# Patient Record
Sex: Female | Born: 1985
Health system: Southern US, Community
[De-identification: ages and names within clinical notes are randomized; demographics above are authoritative.]

## PROBLEM LIST (undated history)

## (undated) DIAGNOSIS — D219 Benign neoplasm of connective and other soft tissue, unspecified: Secondary | ICD-10-CM

## (undated) HISTORY — DX: Benign neoplasm of connective and other soft tissue, unspecified: D21.9

## (undated) HISTORY — PX: WISDOM TOOTH EXTRACTION: SHX21

---

## 2017-04-06 DIAGNOSIS — Z6833 Body mass index (BMI) 33.0-33.9, adult: Secondary | ICD-10-CM | POA: Diagnosis not present

## 2017-04-06 DIAGNOSIS — Z369 Encounter for antenatal screening, unspecified: Secondary | ICD-10-CM | POA: Diagnosis not present

## 2017-04-06 DIAGNOSIS — Z113 Encounter for screening for infections with a predominantly sexual mode of transmission: Secondary | ICD-10-CM | POA: Diagnosis not present

## 2017-04-06 DIAGNOSIS — N925 Other specified irregular menstruation: Secondary | ICD-10-CM | POA: Diagnosis not present

## 2017-04-06 DIAGNOSIS — Z01419 Encounter for gynecological examination (general) (routine) without abnormal findings: Secondary | ICD-10-CM | POA: Diagnosis not present

## 2017-04-06 DIAGNOSIS — Z124 Encounter for screening for malignant neoplasm of cervix: Secondary | ICD-10-CM | POA: Diagnosis not present

## 2017-04-06 LAB — OB RESULTS CONSOLE ABO/RH: RH Type: POSITIVE

## 2017-04-06 LAB — OB RESULTS CONSOLE RUBELLA ANTIBODY, IGM: Rubella: IMMUNE

## 2017-04-06 LAB — OB RESULTS CONSOLE ANTIBODY SCREEN: ANTIBODY SCREEN: NEGATIVE

## 2017-04-06 LAB — OB RESULTS CONSOLE HEPATITIS B SURFACE ANTIGEN: Hepatitis B Surface Ag: NEGATIVE

## 2017-04-06 LAB — OB RESULTS CONSOLE GC/CHLAMYDIA
CHLAMYDIA, DNA PROBE: NEGATIVE
GC PROBE AMP, GENITAL: NEGATIVE

## 2017-04-06 LAB — OB RESULTS CONSOLE HIV ANTIBODY (ROUTINE TESTING): HIV: NONREACTIVE

## 2017-04-06 LAB — OB RESULTS CONSOLE RPR: RPR: NONREACTIVE

## 2017-04-12 DIAGNOSIS — O3680X0 Pregnancy with inconclusive fetal viability, not applicable or unspecified: Secondary | ICD-10-CM | POA: Diagnosis not present

## 2017-04-12 DIAGNOSIS — Z3A01 Less than 8 weeks gestation of pregnancy: Secondary | ICD-10-CM | POA: Diagnosis not present

## 2017-04-12 DIAGNOSIS — O26859 Spotting complicating pregnancy, unspecified trimester: Secondary | ICD-10-CM | POA: Diagnosis not present

## 2017-04-12 DIAGNOSIS — D259 Leiomyoma of uterus, unspecified: Secondary | ICD-10-CM | POA: Diagnosis not present

## 2017-06-07 DIAGNOSIS — Z3492 Encounter for supervision of normal pregnancy, unspecified, second trimester: Secondary | ICD-10-CM | POA: Diagnosis not present

## 2017-06-07 DIAGNOSIS — Z8619 Personal history of other infectious and parasitic diseases: Secondary | ICD-10-CM | POA: Diagnosis not present

## 2017-06-07 DIAGNOSIS — Z3A14 14 weeks gestation of pregnancy: Secondary | ICD-10-CM | POA: Diagnosis not present

## 2017-07-05 DIAGNOSIS — Z3492 Encounter for supervision of normal pregnancy, unspecified, second trimester: Secondary | ICD-10-CM | POA: Diagnosis not present

## 2017-07-05 DIAGNOSIS — Z3A18 18 weeks gestation of pregnancy: Secondary | ICD-10-CM | POA: Diagnosis not present

## 2017-07-07 DIAGNOSIS — O139 Gestational [pregnancy-induced] hypertension without significant proteinuria, unspecified trimester: Secondary | ICD-10-CM | POA: Diagnosis not present

## 2017-07-22 DIAGNOSIS — Z3A20 20 weeks gestation of pregnancy: Secondary | ICD-10-CM | POA: Diagnosis not present

## 2017-07-22 DIAGNOSIS — O10019 Pre-existing essential hypertension complicating pregnancy, unspecified trimester: Secondary | ICD-10-CM | POA: Diagnosis not present

## 2017-07-22 DIAGNOSIS — Z363 Encounter for antenatal screening for malformations: Secondary | ICD-10-CM | POA: Diagnosis not present

## 2017-07-22 DIAGNOSIS — Z369 Encounter for antenatal screening, unspecified: Secondary | ICD-10-CM | POA: Diagnosis not present

## 2017-07-22 DIAGNOSIS — Z6839 Body mass index (BMI) 39.0-39.9, adult: Secondary | ICD-10-CM | POA: Diagnosis not present

## 2017-08-18 DIAGNOSIS — O10019 Pre-existing essential hypertension complicating pregnancy, unspecified trimester: Secondary | ICD-10-CM | POA: Diagnosis not present

## 2017-08-18 DIAGNOSIS — Z3A24 24 weeks gestation of pregnancy: Secondary | ICD-10-CM | POA: Diagnosis not present

## 2017-09-15 DIAGNOSIS — Z3A28 28 weeks gestation of pregnancy: Secondary | ICD-10-CM | POA: Diagnosis not present

## 2017-09-15 DIAGNOSIS — O169 Unspecified maternal hypertension, unspecified trimester: Secondary | ICD-10-CM | POA: Diagnosis not present

## 2017-09-15 DIAGNOSIS — I10 Essential (primary) hypertension: Secondary | ICD-10-CM | POA: Diagnosis not present

## 2017-09-15 DIAGNOSIS — Z369 Encounter for antenatal screening, unspecified: Secondary | ICD-10-CM | POA: Diagnosis not present

## 2017-10-14 DIAGNOSIS — I10 Essential (primary) hypertension: Secondary | ICD-10-CM | POA: Diagnosis not present

## 2017-10-14 DIAGNOSIS — Z3A32 32 weeks gestation of pregnancy: Secondary | ICD-10-CM | POA: Diagnosis not present

## 2017-10-14 DIAGNOSIS — Z3493 Encounter for supervision of normal pregnancy, unspecified, third trimester: Secondary | ICD-10-CM | POA: Diagnosis not present

## 2017-10-21 DIAGNOSIS — I10 Essential (primary) hypertension: Secondary | ICD-10-CM | POA: Diagnosis not present

## 2017-10-27 DIAGNOSIS — Z3A34 34 weeks gestation of pregnancy: Secondary | ICD-10-CM | POA: Diagnosis not present

## 2017-10-27 DIAGNOSIS — O10019 Pre-existing essential hypertension complicating pregnancy, unspecified trimester: Secondary | ICD-10-CM | POA: Diagnosis not present

## 2017-10-27 DIAGNOSIS — Z23 Encounter for immunization: Secondary | ICD-10-CM | POA: Diagnosis not present

## 2017-10-27 DIAGNOSIS — I10 Essential (primary) hypertension: Secondary | ICD-10-CM | POA: Diagnosis not present

## 2017-11-05 DIAGNOSIS — Z3A36 36 weeks gestation of pregnancy: Secondary | ICD-10-CM | POA: Diagnosis not present

## 2017-11-05 DIAGNOSIS — O10019 Pre-existing essential hypertension complicating pregnancy, unspecified trimester: Secondary | ICD-10-CM | POA: Diagnosis not present

## 2017-11-11 DIAGNOSIS — Z369 Encounter for antenatal screening, unspecified: Secondary | ICD-10-CM | POA: Diagnosis not present

## 2017-11-11 DIAGNOSIS — Z3A36 36 weeks gestation of pregnancy: Secondary | ICD-10-CM | POA: Diagnosis not present

## 2017-11-11 DIAGNOSIS — O10019 Pre-existing essential hypertension complicating pregnancy, unspecified trimester: Secondary | ICD-10-CM | POA: Diagnosis not present

## 2017-11-11 LAB — OB RESULTS CONSOLE GBS: GBS: POSITIVE

## 2017-11-17 DIAGNOSIS — O10019 Pre-existing essential hypertension complicating pregnancy, unspecified trimester: Secondary | ICD-10-CM | POA: Diagnosis not present

## 2017-11-17 DIAGNOSIS — Z3A37 37 weeks gestation of pregnancy: Secondary | ICD-10-CM | POA: Diagnosis not present

## 2017-11-24 DIAGNOSIS — Z3493 Encounter for supervision of normal pregnancy, unspecified, third trimester: Secondary | ICD-10-CM | POA: Diagnosis not present

## 2017-11-24 DIAGNOSIS — Z3A38 38 weeks gestation of pregnancy: Secondary | ICD-10-CM | POA: Diagnosis not present

## 2017-11-24 DIAGNOSIS — O10019 Pre-existing essential hypertension complicating pregnancy, unspecified trimester: Secondary | ICD-10-CM | POA: Diagnosis not present

## 2017-11-25 ENCOUNTER — Telehealth (HOSPITAL_COMMUNITY): Payer: Self-pay | Admitting: *Deleted

## 2017-11-25 ENCOUNTER — Encounter (HOSPITAL_COMMUNITY): Payer: Self-pay | Admitting: *Deleted

## 2017-11-25 NOTE — Telephone Encounter (Signed)
Preadmission screen  

## 2017-11-26 ENCOUNTER — Other Ambulatory Visit: Payer: Self-pay | Admitting: Obstetrics & Gynecology

## 2017-11-28 ENCOUNTER — Inpatient Hospital Stay (HOSPITAL_COMMUNITY)
Admission: RE | Admit: 2017-11-28 | Discharge: 2017-11-28 | Disposition: A | Discharge status: Home / Self Care | Payer: BLUE CROSS/BLUE SHIELD | Source: Ambulatory Visit | Attending: Obstetrics & Gynecology | Admitting: Obstetrics & Gynecology

## 2017-11-30 ENCOUNTER — Encounter (HOSPITAL_COMMUNITY): Payer: Self-pay | Admitting: *Deleted

## 2017-11-30 ENCOUNTER — Telehealth (HOSPITAL_COMMUNITY): Payer: Self-pay | Admitting: *Deleted

## 2017-11-30 ENCOUNTER — Other Ambulatory Visit: Payer: Self-pay

## 2017-11-30 ENCOUNTER — Inpatient Hospital Stay (HOSPITAL_COMMUNITY)
Admission: AD | Admit: 2017-11-30 | Discharge: 2017-12-06 | DRG: 787 | Disposition: A | Discharge status: Home / Self Care | Payer: BLUE CROSS/BLUE SHIELD | Source: Ambulatory Visit | Attending: Obstetrics and Gynecology | Admitting: Obstetrics and Gynecology

## 2017-11-30 DIAGNOSIS — J1189 Influenza due to unidentified influenza virus with other manifestations: Secondary | ICD-10-CM | POA: Diagnosis not present

## 2017-11-30 DIAGNOSIS — O99824 Streptococcus B carrier state complicating childbirth: Secondary | ICD-10-CM | POA: Diagnosis not present

## 2017-11-30 DIAGNOSIS — O9963 Diseases of the digestive system complicating the puerperium: Secondary | ICD-10-CM | POA: Diagnosis not present

## 2017-11-30 DIAGNOSIS — J111 Influenza due to unidentified influenza virus with other respiratory manifestations: Secondary | ICD-10-CM | POA: Diagnosis present

## 2017-11-30 DIAGNOSIS — O164 Unspecified maternal hypertension, complicating childbirth: Secondary | ICD-10-CM | POA: Diagnosis not present

## 2017-11-30 DIAGNOSIS — Z3A00 Weeks of gestation of pregnancy not specified: Secondary | ICD-10-CM | POA: Diagnosis not present

## 2017-11-30 DIAGNOSIS — O1002 Pre-existing essential hypertension complicating childbirth: Principal | ICD-10-CM | POA: Diagnosis present

## 2017-11-30 DIAGNOSIS — O9952 Diseases of the respiratory system complicating childbirth: Secondary | ICD-10-CM | POA: Diagnosis present

## 2017-11-30 DIAGNOSIS — R112 Nausea with vomiting, unspecified: Secondary | ICD-10-CM | POA: Diagnosis not present

## 2017-11-30 DIAGNOSIS — O3413 Maternal care for benign tumor of corpus uteri, third trimester: Secondary | ICD-10-CM | POA: Diagnosis present

## 2017-11-30 DIAGNOSIS — O43893 Other placental disorders, third trimester: Secondary | ICD-10-CM | POA: Diagnosis not present

## 2017-11-30 DIAGNOSIS — O10013 Pre-existing essential hypertension complicating pregnancy, third trimester: Secondary | ICD-10-CM | POA: Diagnosis not present

## 2017-11-30 DIAGNOSIS — Z3A39 39 weeks gestation of pregnancy: Secondary | ICD-10-CM

## 2017-11-30 DIAGNOSIS — K567 Ileus, unspecified: Secondary | ICD-10-CM | POA: Diagnosis not present

## 2017-11-30 DIAGNOSIS — D259 Leiomyoma of uterus, unspecified: Secondary | ICD-10-CM | POA: Diagnosis present

## 2017-11-30 DIAGNOSIS — J101 Influenza due to other identified influenza virus with other respiratory manifestations: Secondary | ICD-10-CM | POA: Diagnosis present

## 2017-11-30 DIAGNOSIS — Z98891 History of uterine scar from previous surgery: Secondary | ICD-10-CM

## 2017-11-30 DIAGNOSIS — O3663X0 Maternal care for excessive fetal growth, third trimester, not applicable or unspecified: Secondary | ICD-10-CM | POA: Diagnosis not present

## 2017-11-30 DIAGNOSIS — O139 Gestational [pregnancy-induced] hypertension without significant proteinuria, unspecified trimester: Secondary | ICD-10-CM | POA: Diagnosis not present

## 2017-11-30 HISTORY — DX: Pre-existing essential hypertension complicating pregnancy, third trimester: O10.013

## 2017-11-30 LAB — INFLUENZA PANEL BY PCR (TYPE A & B)
INFLAPCR: POSITIVE — AB
Influenza B By PCR: NEGATIVE

## 2017-11-30 LAB — COMPREHENSIVE METABOLIC PANEL
ALT: 14 U/L (ref 14–54)
ANION GAP: 10 (ref 5–15)
AST: 20 U/L (ref 15–41)
Albumin: 3.1 g/dL — ABNORMAL LOW (ref 3.5–5.0)
Alkaline Phosphatase: 126 U/L (ref 38–126)
BILIRUBIN TOTAL: 0.6 mg/dL (ref 0.3–1.2)
BUN: 10 mg/dL (ref 6–20)
CO2: 19 mmol/L — ABNORMAL LOW (ref 22–32)
Calcium: 9.6 mg/dL (ref 8.9–10.3)
Chloride: 103 mmol/L (ref 101–111)
Creatinine, Ser: 0.54 mg/dL (ref 0.44–1.00)
GFR calc Af Amer: >60 mL/min (ref >60)
Glucose, Bld: 96 mg/dL (ref 65–99)
POTASSIUM: 4 mmol/L (ref 3.5–5.1)
Sodium: 132 mmol/L — ABNORMAL LOW (ref 135–145)
TOTAL PROTEIN: 7 g/dL (ref 6.5–8.1)

## 2017-11-30 LAB — CBC
HCT: 32.7 % — ABNORMAL LOW (ref 36.0–46.0)
Hemoglobin: 11.1 g/dL — ABNORMAL LOW (ref 12.0–15.0)
MCH: 29.1 pg (ref 26.0–34.0)
MCHC: 33.9 g/dL (ref 30.0–36.0)
MCV: 85.8 fL (ref 78.0–100.0)
PLATELETS: 182 10*3/uL (ref 150–400)
RBC: 3.81 MIL/uL — ABNORMAL LOW (ref 3.87–5.11)
RDW: 15 % (ref 11.5–15.5)
WBC: 8 10*3/uL (ref 4.0–10.5)

## 2017-11-30 LAB — PROTEIN / CREATININE RATIO, URINE
Creatinine, Urine: 58 mg/dL
Protein Creatinine Ratio: 0.14 mg/mg{Cre} (ref 0.00–0.15)
TOTAL PROTEIN, URINE: 8 mg/dL

## 2017-11-30 LAB — LACTATE DEHYDROGENASE: LDH: 149 U/L (ref 98–192)

## 2017-11-30 LAB — URIC ACID: Uric Acid, Serum: 4.1 mg/dL (ref 2.3–6.6)

## 2017-11-30 MED ORDER — ONDANSETRON HCL 4 MG/2ML IJ SOLN: 4.0000 mg | Freq: Four times a day (QID) | INTRAMUSCULAR | Status: DC | PRN

## 2017-11-30 MED ORDER — LACTATED RINGERS IV SOLN
INTRAVENOUS | Status: DC
  Administered 2017-11-30 – 2017-12-02 (×3): via INTRAVENOUS

## 2017-11-30 MED ORDER — FENTANYL CITRATE (PF) 100 MCG/2ML IJ SOLN
50.0000 ug | INTRAMUSCULAR | Status: DC | PRN
  Administered 2017-12-01: 100 ug via INTRAVENOUS
  Filled 2017-11-30: qty 2

## 2017-11-30 MED ORDER — ACETAMINOPHEN 325 MG PO TABS
650.0000 mg | ORAL_TABLET | ORAL | Status: DC | PRN
  Administered 2017-12-01: 650 mg via ORAL

## 2017-11-30 MED ORDER — LACTATED RINGERS IV SOLN: 500.0000 mL | INTRAVENOUS | Status: DC | PRN

## 2017-11-30 MED ORDER — PENICILLIN G POT IN DEXTROSE 60000 UNIT/ML IV SOLN
3.0000 10*6.[IU] | INTRAVENOUS | Status: DC
  Administered 2017-12-01 – 2017-12-02 (×6): 3 10*6.[IU] via INTRAVENOUS
  Filled 2017-11-30 (×13): qty 50

## 2017-11-30 MED ORDER — PENICILLIN G POTASSIUM 5000000 UNITS IJ SOLR
5.0000 10*6.[IU] | Freq: Once | INTRAMUSCULAR | Status: AC
  Administered 2017-12-01: 5 10*6.[IU] via INTRAVENOUS
  Filled 2017-11-30: qty 5

## 2017-11-30 MED ORDER — TERBUTALINE SULFATE 1 MG/ML IJ SOLN: 0.2500 mg | Freq: Once | INTRAMUSCULAR | Status: DC | PRN

## 2017-11-30 MED ORDER — FLEET ENEMA 7-19 GM/118ML RE ENEM: 1.0000 | ENEMA | RECTAL | Status: DC | PRN

## 2017-11-30 MED ORDER — OXYTOCIN 40 UNITS IN LACTATED RINGERS INFUSION - SIMPLE MED: 1.0000 m[IU]/min | INTRAVENOUS | Status: DC

## 2017-11-30 MED ORDER — LIDOCAINE HCL (PF) 1 % IJ SOLN: 30.0000 mL | INTRAMUSCULAR | Status: DC | PRN

## 2017-11-30 MED ORDER — OXYTOCIN 40 UNITS IN LACTATED RINGERS INFUSION - SIMPLE MED: 2.5000 [IU]/h | INTRAVENOUS | Status: DC

## 2017-11-30 MED ORDER — MISOPROSTOL 25 MCG QUARTER TABLET
25.0000 ug | ORAL_TABLET | ORAL | Status: DC | PRN
Start: 2017-11-30 — End: 2017-12-02
  Administered 2017-11-30 – 2017-12-01 (×2): 25 ug via VAGINAL
  Filled 2017-11-30 (×3): qty 1

## 2017-11-30 MED ORDER — OXYTOCIN BOLUS FROM INFUSION: 500.0000 mL | Freq: Once | INTRAVENOUS | Status: DC

## 2017-11-30 MED ORDER — SOD CITRATE-CITRIC ACID 500-334 MG/5ML PO SOLN
30.0000 mL | ORAL | Status: DC | PRN
  Administered 2017-12-02: 30 mL via ORAL
  Filled 2017-11-30: qty 15

## 2017-11-30 MED ORDER — OSELTAMIVIR PHOSPHATE 75 MG PO CAPS
75.0000 mg | ORAL_CAPSULE | Freq: Two times a day (BID) | ORAL | Status: DC
  Administered 2017-11-30 – 2017-12-02 (×4): 75 mg via ORAL
  Filled 2017-11-30 (×6): qty 1

## 2017-11-30 NOTE — H&P (Addendum)
Alice Nash is a 32 y.o. female presenting for induction due to presumed CHTN with an exacerbation of BPs today in the office as high as 180/102 and 170/82 at about 4:30pm.  Pt denies HAs, visual changes, abdominal or RUQ pain and reports good FM, no VB, no LOF and no CTXs.  First BP here at hospital was 169/89 and repeat was 151/74.  BPs prior to 20wks 130s-150s/70s-80s.  Pt on no meds for BP before or during pregnancy.  Husband recently dx's with the flu and started on tamiflu yesterday.  She does not recall him having a fever.  OB History    Gravida Para Term Preterm AB Living   1             SAB TAB Ectopic Multiple Live Births           0     Past Medical History:  Diagnosis Date  . Fibroid    Past Surgical History:  Procedure Laterality Date  . WISDOM TOOTH EXTRACTION     Family History: family history includes Hypertension in her mother; Kidney cancer in her father. Social History:  reports that  has never smoked. she has never used smokeless tobacco. She reports that she does not drink alcohol or use drugs.     Maternal Diabetes: No Genetic Screening: Declined Maternal Ultrasounds/Referrals: Normal Fetal Ultrasounds or other Referrals:  None Maternal Substance Abuse:  No Significant Maternal Medications:  None Significant Maternal Lab Results:  Lab values include: Group B Strep positive Other Comments:  None  ROS  Reports a slight cough, but denies F/C/N/V/D.  History   Blood pressure (!) 151/74, pulse (!) 110, temperature 99.1 F (37.3 C), temperature source Oral, resp. rate 18, height 5\' 4"  (1.626 m), weight 215 lb (97.5 kg), last menstrual period 02/20/2017. Exam Physical Exam  Lungs CTA CV RRR Abd gravid, NT Ext no calf tenderness FHT 150, +accels, no decels, mod variability Toco none Bedside u/s - cephalic presentation Cervical exam in the office by Dr. Charlesetta Garibaldi closed/50%/-4  Prenatal labs: ABO, Rh: O/Positive/-- (05/29 0000) Antibody: Negative  (05/29 0000) Rubella: Immune (05/29 0000) RPR: Nonreactive (05/29 0000)  HBsAg: Negative (05/29 0000)  HIV: Non-reactive (05/29 0000)  GBS: Positive (01/03 0000)   Assessment/Plan: P0 at 49 1/7wks with CHTN and exacerbation in the office earlier today.  If develops persistent severe range BPs, will start MgSO4 for SI severe preeclampsia.  Labs ordered.  Due to pts exposure to the flu, will do flu swab but pt has no obvious sxs suggesting she has the flu.  The husband will be allowed to come to the hospital per hospital protocol.  Cervix is unfavorable.  Will start induction with cervical ripening via cytotec.  I discussed r/b/a with pt including the increased risk of c/s.  Questions answered.  Fetal status is cat 1.  GHTN labs, Urine PCR and Flu swab pending.  PCN for GBS +.  Pt may have pain medicine upon request.  Delice Lesch 11/30/2017, 7:16 PM

## 2017-11-30 NOTE — H&P (Signed)
Already completed.  Made in error [employer] Past Medical History:  Diagnosis Date  . Fibroid

## 2017-11-30 NOTE — Telephone Encounter (Signed)
Preadmission screen  

## 2017-12-01 ENCOUNTER — Encounter (HOSPITAL_COMMUNITY): Payer: Self-pay | Admitting: *Deleted

## 2017-12-01 LAB — RPR: RPR Ser Ql: NONREACTIVE

## 2017-12-01 MED ORDER — PHENYLEPHRINE 40 MCG/ML (10ML) SYRINGE FOR IV PUSH (FOR BLOOD PRESSURE SUPPORT): 80.0000 ug | PREFILLED_SYRINGE | INTRAVENOUS | Status: DC | PRN

## 2017-12-01 MED ORDER — OXYTOCIN 40 UNITS IN LACTATED RINGERS INFUSION - SIMPLE MED
1.0000 m[IU]/min | INTRAVENOUS | Status: DC
  Administered 2017-12-01: 2 m[IU]/min via INTRAVENOUS
  Filled 2017-12-01: qty 1000

## 2017-12-01 MED ORDER — ZOLPIDEM TARTRATE 5 MG PO TABS: 5.0000 mg | ORAL_TABLET | Freq: Every evening | ORAL | Status: DC | PRN

## 2017-12-01 MED ORDER — DIPHENHYDRAMINE HCL 50 MG/ML IJ SOLN: 12.5000 mg | INTRAMUSCULAR | Status: DC | PRN

## 2017-12-01 MED ORDER — TERBUTALINE SULFATE 1 MG/ML IJ SOLN: 0.2500 mg | Freq: Once | INTRAMUSCULAR | Status: DC | PRN

## 2017-12-01 MED ORDER — FENTANYL 2.5 MCG/ML BUPIVACAINE 1/10 % EPIDURAL INFUSION (WH - ANES): 14.0000 mL/h | INTRAMUSCULAR | Status: DC | PRN

## 2017-12-01 MED ORDER — MISOPROSTOL 25 MCG QUARTER TABLET
25.0000 ug | ORAL_TABLET | ORAL | Status: DC
  Administered 2017-12-01: 25 ug via VAGINAL
  Filled 2017-12-01 (×5): qty 1

## 2017-12-01 MED ORDER — EPHEDRINE 5 MG/ML INJ: 10.0000 mg | INTRAVENOUS | Status: DC | PRN

## 2017-12-01 MED ORDER — ACETAMINOPHEN 325 MG PO TABS
650.0000 mg | ORAL_TABLET | Freq: Four times a day (QID) | ORAL | Status: DC | PRN
  Filled 2017-12-01: qty 2

## 2017-12-01 MED ORDER — LACTATED RINGERS IV SOLN
500.0000 mL | Freq: Once | INTRAVENOUS | Status: AC
  Administered 2017-12-02: 500 mL via INTRAVENOUS

## 2017-12-01 NOTE — Progress Notes (Signed)
[facility] for Infectious Disease  Total days of antibiotics 2 tamiflu/day 1 PCN G       Reason for Consult: recommendations for flu positive laboring mother   Referring Physician: Alwyn Pea  Active Problems:   Chronic benign essential hypertension in third trimester    HPI: Alice Nash is a 32 y.o. female G1P1, [redacted] weeks gestation who was admitted for induction due to hypertension. She reported that her husband was diagnosed with influenza and started on oseltamivir. She did not endorse cough or fever at that time. Primary tested her and dx with influenza A on 1/22.she was started on oseltamivir on 1/22 evening.  Primary team has asked Infection Prevention/ID for recommendations.   Prenatal labs: ABO, Rh: O/Positive/-- (05/29 0000) Antibody: Negative (05/29 0000) Rubella: Immune (05/29 0000) RPR: Nonreactive (05/29 0000)  HBsAg: Negative (05/29 0000)  HIV: Non-reactive (05/29 0000)  GBS: Positive (01/03 0000)     Past Medical History:  Diagnosis Date  . Fibroid     Allergies: No Known Allergies  MEDICATIONS: . misoprostol  25 mcg Vaginal Q4H  . oseltamivir  75 mg Oral BID  . oxytocin 40 units in LR 1000 mL  500 mL Intravenous Once    Social History   Tobacco Use  . Smoking status: Never Smoker  . Smokeless tobacco: Never Used  Substance Use Topics  . Alcohol use: No    Frequency: Never  . Drug use: No    Family History  Problem Relation Age of Onset  . Hypertension Mother   . Kidney cancer Father    LABS: Results for orders placed or performed during the hospital encounter of 11/30/17 (from the past 48 hour(s))  CBC     Status: Abnormal   Collection Time: 11/30/17  6:18 PM  Result Value Ref Range   WBC 8.0 4.0 - 10.5 K/uL   RBC 3.81 (L) 3.87 - 5.11 MIL/uL   Hemoglobin 11.1 (L) 12.0 - 15.0 g/dL   HCT 32.7 (L) 36.0 - 46.0 %   MCV 85.8 78.0 - 100.0 fL   MCH 29.1 26.0 - 34.0 pg   MCHC 33.9 30.0 - 36.0 g/dL   RDW 15.0 11.5 - 15.5 %   Platelets  182 150 - 400 K/uL  RPR     Status: None   Collection Time: 11/30/17  6:18 PM  Result Value Ref Range   RPR Ser Ql Non Reactive Non Reactive    Comment: (NOTE) Performed At: [facility] [place], [place] [id number] Rush Farmer MD DZ:[id number]   Comprehensive metabolic panel     Status: Abnormal   Collection Time: 11/30/17  6:18 PM  Result Value Ref Range   Sodium 132 (L) 135 - 145 mmol/L   Potassium 4.0 3.5 - 5.1 mmol/L   Chloride 103 101 - 111 mmol/L   CO2 19 (L) 22 - 32 mmol/L   Glucose, Bld 96 65 - 99 mg/dL   BUN 10 6 - 20 mg/dL   Creatinine, Ser 0.54 0.44 - 1.00 mg/dL   Calcium 9.6 8.9 - 10.3 mg/dL   Total Protein 7.0 6.5 - 8.1 g/dL   Albumin 3.1 (L) 3.5 - 5.0 g/dL   AST 20 15 - 41 U/L   ALT 14 14 - 54 U/L   Alkaline Phosphatase 126 38 - 126 U/L   Total Bilirubin 0.6 0.3 - 1.2 mg/dL   GFR calc non Af Amer >60 >60 mL/min   GFR calc Af Amer >60 >60  mL/min    Comment: (NOTE) The eGFR has been calculated using the CKD EPI equation. This calculation has not been validated in all clinical situations. eGFR's persistently <60 mL/min signify possible Chronic Kidney Disease.    Anion gap 10 5 - 15  Uric acid     Status: None   Collection Time: 11/30/17  6:18 PM  Result Value Ref Range   Uric Acid, Serum 4.1 2.3 - 6.6 mg/dL  Lactate dehydrogenase     Status: None   Collection Time: 11/30/17  6:18 PM  Result Value Ref Range   LDH 149 98 - 192 U/L  Influenza panel by PCR (type A & B)     Status: Abnormal   Collection Time: 11/30/17  6:46 PM  Result Value Ref Range   Influenza A By PCR POSITIVE (A) NEGATIVE    Comment: RESULT CALLED TO, READ BACK BY AND VERIFIED WITH: SULLIVAN K AT 1952 ON [id number] BY FORSYTH K    Influenza B By PCR NEGATIVE NEGATIVE    Comment: (NOTE) The Xpert Xpress Flu assay is intended as an aid in the diagnosis of  influenza and should not be used as a sole basis for treatment.  This  assay is FDA approved for nasopharyngeal  swab specimens only. Nasal  washings and aspirates are unacceptable for Xpert Xpress Flu testing.   Protein / creatinine ratio, urine     Status: None   Collection Time: 11/30/17  7:23 PM  Result Value Ref Range   Creatinine, Urine 58.00 mg/dL   Total Protein, Urine 8 mg/dL    Comment: NO NORMAL RANGE ESTABLISHED FOR THIS TEST   Protein Creatinine Ratio 0.14 0.00 - 0.15 mg/mg[Cre]    Assessment/Plan:  Influenza treatment and peri-post management   1) please assess if Alice Nash has received the flu vaccine. If not, please administer to her at discharge 2) continue to treat influenza with oseltamivir 77m BID x 10 doses 3) keep patient on standard /droplet precaution for hospitalized patients for 7 days after illness onset  4) patient and her family members and other visitors should be informed of the risks of influenza virus transmission and instructed to adhere to respiratory hygiene and cough etiquette, hand hygiene, and use of personal protective equipment (PPE)    At delivery: 1) staff to follow standard/droplet precaution. Alice SAldazdoes not have to be masked while she is laboring. Staff should be. 2) Do mask the mother once baby is delivered so that she may see/ touch/ hold/be on chest  Post Delivery: 1) please keep newborn separated from mom until she has had 48hrs of oseltamivir. The baby may then room with mother if she chooses 2) bring baby to mother (not have mother go inside nursery during this separation time) 2) keep baby at 66ft + from mother while in room- if she is not holding/feeding baby. If baby is closer than 6 ft., mother should wear mask 3) please teach mom to do hand hygiene before and after every contact with her infant 3) during the first 24-48hrs, baby should be bottlefed (with breast milk or supplementation) by a healthy caregiver/or staff member 4) mother can breastpump during first 24-48hrs , while they are separated and then resume breast-feeding once  they are in the same room- if mother chooses vs. Continue to use breastpump/bottle feed for next 5 d.  At Discharge: 5) offer vaccination for influenza to mother and recommend to father of baby if they had not received it  If questions, please contact IP on call or myself  Caren Griffins B. [facility] for Infectious Diseases [phone]

## 2017-12-01 NOTE — Anesthesia Pain Management Evaluation Note (Signed)
  CRNA Pain Management Visit Note  Patient: Alice Nash, 32 y.o., female  "Hello I am a member of the anesthesia team at [facility]. We have an anesthesia team available at all times to provide care throughout the hospital, including epidural management and anesthesia for C-section. I don't know your plan for the delivery whether it a natural birth, water birth, IV sedation, nitrous supplementation, doula or epidural, but we want to meet your pain goals."   1.Was your pain managed to your expectations on prior hospitalizations?   No prior hospitalizations  2.What is your expectation for pain management during this hospitalization?     Epidural  3.How can we help you reach that goal? Epidural when pain goal is reached.  Record the patient's initial score and the patient's pain goal.   Pain: 0  Pain Goal: 7 [facility] wants you to be able to say your pain was always managed very well.  [name] 12/01/2017

## 2017-12-01 NOTE — Progress Notes (Signed)
Subjective: Pt would like plan regarding recommendations for infant separation and feeding. Denies headache. Pt denies cough or body aches. Feeling some uterine cramping.  UPC negative Influenza A positive  Objective: BP (!) 151/81   Pulse (!) 110   Temp 99.6 F (37.6 C) (Oral)   Resp 16   Ht 5\' 4"  (1.626 m)   Wt 97.5 kg (215 lb)   LMP 02/20/2017   BMI 36.90 kg/m  No intake/output data recorded. No intake/output data recorded.  FHT: Category 1  FHT BL 150 variablity present no decels noted. UC:   regular, every 1-3 minutes SVE:   1/thick/-2 Discussed foley bulb insertion.  Inserted with speculum with 60cc fluid placed in bulb. Assessment:  G1P0 at 39.2 IUP induction for chronic hypertension Cat 1 strip Influenza A  Plan: Tamiflu Tylenol as needed ID consult in am for [place], MSN 12/01/2017, 2:02 AM

## 2017-12-01 NOTE — Progress Notes (Signed)
Received call last night from charge nurse Janett Billow on 11/10/17 at 38 regarding procedure for care for infant in the setting of mother and father with current influenza infection.  Advised that per CDC's website, it is recommended that infant be temporarily separated from mother with droplet precaution in place (while she is inpatient) for 7 days or until mother is completely symptom free, which ever is longer.  Suggested that infant could be cared for by other family members who are well in a separate room and given expressed breast milk or formula if no breast milk is available. Isolation would continue post-discharge until again 7 days from diagnosis or mother is symptom-free, which is longer.  Janett Billow agreed to alert nursing of these recommendations.  Received a call again this morning around 9:34 am from charge nurse Anderson Malta that mother requested a call from me regarding these isolation recommendations.  I explained to [place] that I was at the office with multiple patients but since mom was not actively laboring, I would call her personally once I finished my morning clinic.  I personally called mom at 12:02 pm and discussed the above recommendations.  Explained to mom that these recommendations were per the CDC.  Mom inquired about infant rooming in with her and I advised that he would need to remain 6 feet away and she would need to wear a mask and also do proper hand-washing when infant in close contact with her but that would not be 100% protective.  Explained that influenza infection in a neonate is a very serious infection which could result in severe complications and even death.  Mom is in the process of deciding if infant will room with her or if another well family member can care for the infant during the recommended isolation period.  I assured mom that I had personally spoken with charge nurse Anderson Malta and we would be able to provide a separate room for her infant to be cared for by a family  member while mom remained hospitalized.  Once infant is discharged home, then he should still be isolated from mother and father until either 7 days from diagnosis or family member is completely symptom free, which ever is longer.  Suggested that they decontaminate the apartment using Chlorox wipes and Lysol spray on all countertop surfaces, door knobs, and light switches and dad himself should be isolated to one room in the house presently.  The other option would be for infant to be temporarily housed in another family member's home if mom is worried about returning to the apartment immediately or if apartment has not been decontaminated yet.  Advised mom that lactation would be available to help her pump to protect her milk supply and infant will be able to bond with her once she is well as there are times when infants are initially cared for in the NICU or the mother is separated from the infant in the ICU but bonding does occur at a later time.  Mom should continue her Tamiflu as prescribed by her provider. All of mom's questions were answered and she noted that she would discuss this information with dad and make her decision.

## 2017-12-01 NOTE — Progress Notes (Signed)
Had a conversation with [facility] physician.  As long a mother has been on Tamiflu for 48 hours and is afebrile and showing no symptoms, she should be able to hold her baby w/o risk of Flu exposure.     Patient is currently admitted for induction of labor for gestational hypertension and her nasal swabs were positive for flu, however she is asymptomatic.   Alice Nash Alice Nash

## 2017-12-01 NOTE — Progress Notes (Addendum)
Leticia Coletta MRN: [id number]  Subjective: -Care assumed of 32 y.o. G1P0 at [redacted]w[redacted]d who presents for IOL s/t CHTN w/o official diagnosis prior to pregnancy and positive Influenza. In room to meet acquaintance of patient and family.  Patient reports comfort despite occasional contraction.  Denies LoF, VB, and reports active fetus.  Patient denies HA, Visual disturbances, RUQ Pain, Fever, Chills, N/V, and Diarrhea.   Objective: BP (!) 145/78   Pulse 89   Temp 98.8 F (37.1 C) (Oral)   Resp 16   Ht 5\' 4"  (1.626 m)   Wt 97.5 kg (215 lb)   LMP 02/20/2017   BMI 36.90 kg/m  No intake/output data recorded. No intake/output data recorded.  Fetal Monitoring: FHT: 145 bpm, Mod Var, -Decels, +Accels UC: Q43min, palpates mild to moderate    Physical Exam: General appearance: alert, well appearing, and in no distress. Chest: normal rate and regular rhythm.  clear to auscultation, no wheezes, rales or rhonchi, symmetric air entry. Abdominal exam: Soft RT, Gravid-Appears LGA (H/O Fibroids noted in [facility]) BS Present. Extremities: Edema Skin exam: Warm Dry  Vaginal Exam: SVE:   Dilation: 1.5 Effacement (%): Thick Station: -3 Exam by:: J.Michalene Debruler, CNM Membranes:Intact Internal Monitors: None  Augmentation/Induction: Pitocin:88mUn/min Cytotec: S/P Foley Bulb inserted w/o Incident  Assessment:  IUP at 39.2wks Cat I FT  CHTN Positive Influenza-Asymptomatic IOL Bishop Score: 3   Plan: -Discussed plan for delivery in case of positive influenza and attempt to minimize infant exposure. -Per Dr. Abner Greenspan (Peds) patient is to have no contact with infant at time of delivery despite tamiflu dosing.  Infant is to room in with family member while patient and husband remain in isolation.  No time frame given -Patient informed that this provider would be following recommendation of pediatrician and would therefore require patient to be masked at time of crowning and will hold infant up for patient to see  prior to handing off to nurses for isolation.  Patient in agreement with plan.  -Discussed foley bulb insertion including r/b, placement, associated discomfort, initiation of pitocin, and removal. -Will continue pitocin at current rate and hold until foley bulb comes out -Continue PCN dosing as scheduled -Discussed IV medication and epidural for pain management when desired -Continue other mgmt as ordered -Plan to reassess prn    Riley Churches, CNM 12/01/2017, 9:02 PM

## 2017-12-02 ENCOUNTER — Inpatient Hospital Stay (HOSPITAL_COMMUNITY): Payer: BLUE CROSS/BLUE SHIELD | Admitting: Anesthesiology

## 2017-12-02 ENCOUNTER — Encounter (HOSPITAL_COMMUNITY)
Admission: AD | Disposition: A | Discharge status: Home / Self Care | Payer: Self-pay | Source: Ambulatory Visit | Attending: Obstetrics and Gynecology

## 2017-12-02 LAB — CBC
HCT: 32 % — ABNORMAL LOW (ref 36.0–46.0)
HEMOGLOBIN: 10.9 g/dL — AB (ref 12.0–15.0)
MCH: 29.4 pg (ref 26.0–34.0)
MCHC: 34.1 g/dL (ref 30.0–36.0)
MCV: 86.3 fL (ref 78.0–100.0)
PLATELETS: 185 10*3/uL (ref 150–400)
RBC: 3.71 MIL/uL — AB (ref 3.87–5.11)
RDW: 15.2 % (ref 11.5–15.5)
WBC: 8.7 10*3/uL (ref 4.0–10.5)

## 2017-12-02 LAB — CREATININE, SERUM
Creatinine, Ser: 0.52 mg/dL (ref 0.44–1.00)
GFR calc Af Amer: >60 mL/min (ref >60)

## 2017-12-02 SURGERY — Surgical Case
Anesthesia: Spinal

## 2017-12-02 MED ORDER — SIMETHICONE 80 MG PO CHEW: 80.0000 mg | CHEWABLE_TABLET | ORAL | Status: DC | PRN

## 2017-12-02 MED ORDER — HYDRALAZINE HCL 20 MG/ML IJ SOLN: 10.0000 mg | Freq: Once | INTRAMUSCULAR | Status: DC | PRN

## 2017-12-02 MED ORDER — MENTHOL 3 MG MT LOZG: 1.0000 | LOZENGE | OROMUCOSAL | Status: DC | PRN

## 2017-12-02 MED ORDER — PROMETHAZINE HCL 25 MG/ML IJ SOLN: 6.2500 mg | INTRAMUSCULAR | Status: DC | PRN

## 2017-12-02 MED ORDER — MEPERIDINE HCL 25 MG/ML IJ SOLN
INTRAMUSCULAR | Status: DC | PRN
  Administered 2017-12-02: 25 mg via INTRAVENOUS

## 2017-12-02 MED ORDER — ONDANSETRON HCL 4 MG/2ML IJ SOLN
INTRAMUSCULAR | Status: DC | PRN
  Administered 2017-12-02: 4 mg via INTRAVENOUS

## 2017-12-02 MED ORDER — SENNOSIDES-DOCUSATE SODIUM 8.6-50 MG PO TABS: 2.0000 | ORAL_TABLET | ORAL | Status: DC

## 2017-12-02 MED ORDER — MORPHINE SULFATE (PF) 0.5 MG/ML IJ SOLN
INTRAMUSCULAR | Status: AC
  Filled 2017-12-02: qty 10

## 2017-12-02 MED ORDER — WITCH HAZEL-GLYCERIN EX PADS: 1.0000 "application " | MEDICATED_PAD | CUTANEOUS | Status: DC | PRN

## 2017-12-02 MED ORDER — ZOLPIDEM TARTRATE 5 MG PO TABS: 5.0000 mg | ORAL_TABLET | Freq: Every evening | ORAL | Status: DC | PRN

## 2017-12-02 MED ORDER — NALBUPHINE HCL 10 MG/ML IJ SOLN: 5.0000 mg | Freq: Once | INTRAMUSCULAR | Status: DC | PRN

## 2017-12-02 MED ORDER — LABETALOL HCL 5 MG/ML IV SOLN: 20.0000 mg | INTRAVENOUS | Status: DC | PRN

## 2017-12-02 MED ORDER — TETANUS-DIPHTH-ACELL PERTUSSIS 5-2.5-18.5 LF-MCG/0.5 IM SUSP: 0.5000 mL | Freq: Once | INTRAMUSCULAR | Status: DC

## 2017-12-02 MED ORDER — OXYCODONE-ACETAMINOPHEN 5-325 MG PO TABS: 1.0000 | ORAL_TABLET | ORAL | Status: DC | PRN

## 2017-12-02 MED ORDER — DIPHENHYDRAMINE HCL 25 MG PO CAPS: 25.0000 mg | ORAL_CAPSULE | ORAL | Status: DC | PRN

## 2017-12-02 MED ORDER — PHENYLEPHRINE 8 MG IN D5W 100 ML (0.08MG/ML) PREMIX OPTIME
INJECTION | INTRAVENOUS | Status: DC | PRN
  Administered 2017-12-02: 60 ug/min via INTRAVENOUS

## 2017-12-02 MED ORDER — NALOXONE HCL 0.4 MG/ML IJ SOLN: 0.4000 mg | INTRAMUSCULAR | Status: DC | PRN

## 2017-12-02 MED ORDER — OXYCODONE-ACETAMINOPHEN 5-325 MG PO TABS: 2.0000 | ORAL_TABLET | ORAL | Status: DC | PRN

## 2017-12-02 MED ORDER — HYDROMORPHONE HCL 1 MG/ML IJ SOLN: 0.2500 mg | INTRAMUSCULAR | Status: DC | PRN

## 2017-12-02 MED ORDER — LACTATED RINGERS IV SOLN
INTRAVENOUS | Status: DC
  Administered 2017-12-03 – 2017-12-05 (×7): via INTRAVENOUS

## 2017-12-02 MED ORDER — DIPHENHYDRAMINE HCL 50 MG/ML IJ SOLN: 12.5000 mg | INTRAMUSCULAR | Status: DC | PRN

## 2017-12-02 MED ORDER — MEPERIDINE HCL 25 MG/ML IJ SOLN
INTRAMUSCULAR | Status: AC
  Filled 2017-12-02: qty 1

## 2017-12-02 MED ORDER — OXYTOCIN 40 UNITS IN LACTATED RINGERS INFUSION - SIMPLE MED: 2.5000 [IU]/h | INTRAVENOUS | Status: AC

## 2017-12-02 MED ORDER — ENOXAPARIN SODIUM 40 MG/0.4ML ~~LOC~~ SOLN
40.0000 mg | SUBCUTANEOUS | Status: DC
  Administered 2017-12-03 – 2017-12-04 (×2): 40 mg via SUBCUTANEOUS
  Filled 2017-12-02 (×5): qty 0.4

## 2017-12-02 MED ORDER — OXYTOCIN 10 UNIT/ML IJ SOLN
INTRAMUSCULAR | Status: AC
  Filled 2017-12-02: qty 4

## 2017-12-02 MED ORDER — ONDANSETRON HCL 4 MG/2ML IJ SOLN
4.0000 mg | Freq: Three times a day (TID) | INTRAMUSCULAR | Status: DC | PRN
  Administered 2017-12-03 (×2): 4 mg via INTRAVENOUS
  Filled 2017-12-02 (×2): qty 2

## 2017-12-02 MED ORDER — DIBUCAINE 1 % RE OINT: 1.0000 "application " | TOPICAL_OINTMENT | RECTAL | Status: DC | PRN

## 2017-12-02 MED ORDER — NALOXONE HCL 4 MG/10ML IJ SOLN: 1.0000 ug/kg/h | INTRAVENOUS | Status: DC | PRN

## 2017-12-02 MED ORDER — BUPIVACAINE HCL (PF) 0.25 % IJ SOLN
INTRAMUSCULAR | Status: AC
  Filled 2017-12-02: qty 20

## 2017-12-02 MED ORDER — SCOPOLAMINE 1 MG/3DAYS TD PT72: 1.0000 | MEDICATED_PATCH | Freq: Once | TRANSDERMAL | Status: DC

## 2017-12-02 MED ORDER — KETOROLAC TROMETHAMINE 30 MG/ML IJ SOLN: 30.0000 mg | Freq: Four times a day (QID) | INTRAMUSCULAR | Status: AC | PRN

## 2017-12-02 MED ORDER — PRENATAL MULTIVITAMIN CH: 1.0000 | ORAL_TABLET | Freq: Every day | ORAL | Status: DC

## 2017-12-02 MED ORDER — ACETAMINOPHEN 10 MG/ML IV SOLN: 1000.0000 mg | Freq: Once | INTRAVENOUS | Status: DC | PRN

## 2017-12-02 MED ORDER — SENNOSIDES-DOCUSATE SODIUM 8.6-50 MG PO TABS
2.0000 | ORAL_TABLET | ORAL | Status: DC
  Administered 2017-12-03 – 2017-12-06 (×3): 2 via ORAL
  Filled 2017-12-02 (×3): qty 2

## 2017-12-02 MED ORDER — MORPHINE SULFATE (PF) 0.5 MG/ML IJ SOLN
INTRAMUSCULAR | Status: DC | PRN
  Administered 2017-12-02: .2 mg via EPIDURAL

## 2017-12-02 MED ORDER — SIMETHICONE 80 MG PO CHEW
80.0000 mg | CHEWABLE_TABLET | ORAL | Status: DC
  Administered 2017-12-03 – 2017-12-06 (×3): 80 mg via ORAL
  Filled 2017-12-02 (×3): qty 1

## 2017-12-02 MED ORDER — MEASLES, MUMPS & RUBELLA VAC ~~LOC~~ INJ: 0.5000 mL | INJECTION | Freq: Once | SUBCUTANEOUS | Status: DC

## 2017-12-02 MED ORDER — IBUPROFEN 600 MG PO TABS: 600.0000 mg | ORAL_TABLET | Freq: Four times a day (QID) | ORAL | Status: DC

## 2017-12-02 MED ORDER — MEPERIDINE HCL 25 MG/ML IJ SOLN: 6.2500 mg | INTRAMUSCULAR | Status: DC | PRN

## 2017-12-02 MED ORDER — CEFAZOLIN SODIUM-DEXTROSE 2-3 GM-%(50ML) IV SOLR
INTRAVENOUS | Status: DC | PRN
  Administered 2017-12-02: 2 g via INTRAVENOUS

## 2017-12-02 MED ORDER — LACTATED RINGERS IV SOLN
INTRAVENOUS | Status: DC | PRN
  Administered 2017-12-02: 18:00:00 via INTRAVENOUS

## 2017-12-02 MED ORDER — HYDRALAZINE HCL 20 MG/ML IJ SOLN
10.0000 mg | Freq: Once | INTRAMUSCULAR | Status: AC | PRN
  Administered 2017-12-04: 10 mg via INTRAVENOUS
  Filled 2017-12-02: qty 1

## 2017-12-02 MED ORDER — PRENATAL MULTIVITAMIN CH
1.0000 | ORAL_TABLET | Freq: Every day | ORAL | Status: DC
  Filled 2017-12-02: qty 1

## 2017-12-02 MED ORDER — METHYLERGONOVINE MALEATE 0.2 MG PO TABS: 0.2000 mg | ORAL_TABLET | ORAL | Status: DC | PRN

## 2017-12-02 MED ORDER — FERROUS SULFATE 325 (65 FE) MG PO TABS: 325.0000 mg | ORAL_TABLET | Freq: Two times a day (BID) | ORAL | Status: DC

## 2017-12-02 MED ORDER — OXYTOCIN 40 UNITS IN LACTATED RINGERS INFUSION - SIMPLE MED: 2.5000 [IU]/h | INTRAVENOUS | Status: DC

## 2017-12-02 MED ORDER — OSELTAMIVIR PHOSPHATE 75 MG PO CAPS
75.0000 mg | ORAL_CAPSULE | Freq: Two times a day (BID) | ORAL | Status: AC
  Administered 2017-12-03 (×3): 75 mg via ORAL
  Filled 2017-12-02 (×6): qty 1

## 2017-12-02 MED ORDER — OXYTOCIN 10 UNIT/ML IJ SOLN
INTRAVENOUS | Status: DC | PRN
  Administered 2017-12-02: 40 [IU] via INTRAVENOUS

## 2017-12-02 MED ORDER — ACETAMINOPHEN 325 MG PO TABS: 650.0000 mg | ORAL_TABLET | ORAL | Status: DC | PRN

## 2017-12-02 MED ORDER — BUPIVACAINE HCL (PF) 0.25 % IJ SOLN
INTRAMUSCULAR | Status: AC
Start: 2017-12-02 — End: 2017-12-02
  Filled 2017-12-02: qty 10

## 2017-12-02 MED ORDER — METHYLERGONOVINE MALEATE 0.2 MG/ML IJ SOLN: 0.2000 mg | INTRAMUSCULAR | Status: DC | PRN

## 2017-12-02 MED ORDER — HYDROCODONE-ACETAMINOPHEN 7.5-325 MG PO TABS: 1.0000 | ORAL_TABLET | Freq: Once | ORAL | Status: DC | PRN

## 2017-12-02 MED ORDER — ONDANSETRON HCL 4 MG/2ML IJ SOLN
INTRAMUSCULAR | Status: AC
  Filled 2017-12-02: qty 2

## 2017-12-02 MED ORDER — DIPHENHYDRAMINE HCL 25 MG PO CAPS: 25.0000 mg | ORAL_CAPSULE | Freq: Four times a day (QID) | ORAL | Status: DC | PRN

## 2017-12-02 MED ORDER — KETOROLAC TROMETHAMINE 30 MG/ML IJ SOLN
30.0000 mg | Freq: Four times a day (QID) | INTRAMUSCULAR | Status: AC | PRN
  Administered 2017-12-02 – 2017-12-03 (×3): 30 mg via INTRAVENOUS
  Filled 2017-12-02 (×3): qty 1

## 2017-12-02 MED ORDER — SIMETHICONE 80 MG PO CHEW
80.0000 mg | CHEWABLE_TABLET | Freq: Three times a day (TID) | ORAL | Status: DC
  Administered 2017-12-06: 80 mg via ORAL
  Filled 2017-12-02 (×3): qty 1

## 2017-12-02 MED ORDER — BUPIVACAINE HCL (PF) 0.25 % IJ SOLN
INTRAMUSCULAR | Status: DC | PRN
  Administered 2017-12-02: 20 mL

## 2017-12-02 MED ORDER — NALBUPHINE HCL 10 MG/ML IJ SOLN: 5.0000 mg | INTRAMUSCULAR | Status: DC | PRN

## 2017-12-02 MED ORDER — COCONUT OIL OIL: 1.0000 "application " | TOPICAL_OIL | Status: DC | PRN

## 2017-12-02 MED ORDER — SODIUM CHLORIDE 0.9% FLUSH: 3.0000 mL | INTRAVENOUS | Status: DC | PRN

## 2017-12-02 MED ORDER — SODIUM CHLORIDE 0.9 % IR SOLN
Status: DC | PRN
  Administered 2017-12-02: 1000 mL

## 2017-12-02 MED ORDER — ENOXAPARIN SODIUM 40 MG/0.4ML ~~LOC~~ SOLN
40.0000 mg | SUBCUTANEOUS | Status: DC
Start: 2017-12-03 — End: 2017-12-02

## 2017-12-02 MED ORDER — FENTANYL CITRATE (PF) 100 MCG/2ML IJ SOLN
INTRAMUSCULAR | Status: AC
  Filled 2017-12-02: qty 2

## 2017-12-02 MED ORDER — SIMETHICONE 80 MG PO CHEW: 80.0000 mg | CHEWABLE_TABLET | Freq: Three times a day (TID) | ORAL | Status: DC

## 2017-12-02 MED ORDER — LACTATED RINGERS IV SOLN
INTRAVENOUS | Status: DC
Start: 2017-12-02 — End: 2017-12-02

## 2017-12-02 MED ORDER — BUPIVACAINE IN DEXTROSE 0.75-8.25 % IT SOLN
INTRATHECAL | Status: DC | PRN
  Administered 2017-12-02: 1.7 mL via INTRATHECAL

## 2017-12-02 MED ORDER — IBUPROFEN 600 MG PO TABS
600.0000 mg | ORAL_TABLET | Freq: Four times a day (QID) | ORAL | Status: DC
  Administered 2017-12-03 – 2017-12-06 (×4): 600 mg via ORAL
  Filled 2017-12-02 (×5): qty 1

## 2017-12-02 MED ORDER — FERROUS SULFATE 325 (65 FE) MG PO TABS
325.0000 mg | ORAL_TABLET | Freq: Two times a day (BID) | ORAL | Status: DC
  Filled 2017-12-02 (×2): qty 1

## 2017-12-02 MED ORDER — FENTANYL CITRATE (PF) 100 MCG/2ML IJ SOLN
INTRAMUSCULAR | Status: DC | PRN
  Administered 2017-12-02 (×2): 50 ug via INTRAVENOUS

## 2017-12-02 MED ORDER — SIMETHICONE 80 MG PO CHEW: 80.0000 mg | CHEWABLE_TABLET | ORAL | Status: DC

## 2017-12-02 MED ORDER — COCONUT OIL OIL
1.0000 "application " | TOPICAL_OIL | Status: DC | PRN
  Administered 2017-12-06: 1 via TOPICAL
  Filled 2017-12-02: qty 120

## 2017-12-02 SURGICAL SUPPLY — 39 items
BENZOIN TINCTURE PRP APPL 2/3 (GAUZE/BANDAGES/DRESSINGS) ×2 IMPLANT
BOOTIES KNEE HIGH SLOAN (MISCELLANEOUS) ×4 IMPLANT
CHLORAPREP W/TINT 26ML (MISCELLANEOUS) ×2 IMPLANT
CLAMP CORD UMBIL (MISCELLANEOUS) IMPLANT
CLOTH BEACON ORANGE TIMEOUT ST (SAFETY) ×2 IMPLANT
CLSR STERI-STRIP ANTIMIC 1/2X4 (GAUZE/BANDAGES/DRESSINGS) ×2 IMPLANT
DRAIN JACKSON PRT FLT 10 (DRAIN) IMPLANT
DRSG OPSITE POSTOP 4X10 (GAUZE/BANDAGES/DRESSINGS) ×2 IMPLANT
ELECT REM PT RETURN 9FT ADLT (ELECTROSURGICAL) ×2
ELECTRODE REM PT RTRN 9FT ADLT (ELECTROSURGICAL) ×1 IMPLANT
EVACUATOR SILICONE 100CC (DRAIN) IMPLANT
EXTRACTOR VACUUM M CUP 4 TUBE (SUCTIONS) IMPLANT
GLOVE BIOGEL PI IND STRL 7.0 (GLOVE) ×2 IMPLANT
GLOVE BIOGEL PI INDICATOR 7.0 (GLOVE) ×2
GLOVE ECLIPSE 6.5 STRL STRAW (GLOVE) ×2 IMPLANT
GOWN STRL REUS W/TWL LRG LVL3 (GOWN DISPOSABLE) ×4 IMPLANT
KIT ABG SYR 3ML LUER SLIP (SYRINGE) IMPLANT
NEEDLE HYPO 22GX1.5 SAFETY (NEEDLE) ×2 IMPLANT
NEEDLE HYPO 25X5/8 SAFETYGLIDE (NEEDLE) IMPLANT
NS IRRIG 1000ML POUR BTL (IV SOLUTION) ×4 IMPLANT
PACK C SECTION WH (CUSTOM PROCEDURE TRAY) ×2 IMPLANT
PAD ABD 7.5X8 STRL (GAUZE/BANDAGES/DRESSINGS) ×2 IMPLANT
PAD OB MATERNITY 4.3X12.25 (PERSONAL CARE ITEMS) ×2 IMPLANT
PENCIL SMOKE EVAC W/HOLSTER (ELECTROSURGICAL) ×2 IMPLANT
RTRCTR C-SECT PINK 25CM LRG (MISCELLANEOUS) ×2 IMPLANT
SPONGE GAUZE 4X4 12PLY STER LF (GAUZE/BANDAGES/DRESSINGS) ×4 IMPLANT
SPONGE LAP 18X18 X RAY DECT (DISPOSABLE) ×2 IMPLANT
STRIP CLOSURE SKIN 1/2X4 (GAUZE/BANDAGES/DRESSINGS) ×2 IMPLANT
SUT MNCRL AB 3-0 PS2 27 (SUTURE) ×2 IMPLANT
SUT PLAIN 2 0 XLH (SUTURE) ×2 IMPLANT
SUT SILK 2 0 FSL 18 (SUTURE) IMPLANT
SUT VIC AB 0 CTX 36 (SUTURE) ×3
SUT VIC AB 0 CTX36XBRD ANBCTRL (SUTURE) ×3 IMPLANT
SUT VIC AB 1 CT1 36 (SUTURE) ×4 IMPLANT
SUT VIC AB 2-0 CT1 27 (SUTURE)
SUT VIC AB 2-0 CT1 TAPERPNT 27 (SUTURE) IMPLANT
SYR 20CC LL (SYRINGE) ×2 IMPLANT
TOWEL OR 17X24 6PK STRL BLUE (TOWEL DISPOSABLE) ×2 IMPLANT
TRAY FOLEY BAG SILVER LF 14FR (SET/KITS/TRAYS/PACK) ×2 IMPLANT

## 2017-12-02 NOTE — Progress Notes (Signed)
Alice Nash is a 32 y.o. G1P0 at 64w3dadmitted for induction of labor due to Hypertension with + Flu test on Tamiflu and +GBS on penicillin  Subjective:  Comfortable with  Labor support without medications  Pitocin at 14 mU/min Contractions every 2-5 minutes, lasting 40 seconds, intensity 3/10    Objective: BP (!) 150/92   Pulse 93   Temp 98 F (36.7 C) (Axillary)   Resp 16   Ht 5\' 4"  (1.626 m)   Wt 97.5 kg (215 lb)   LMP 02/20/2017   BMI 36.90 kg/m  BP ranging from 130-155/80-94 No BP in severe range requiring IV meds  FHT:  Category 1 SVE:   Dilation: 4 Effacement (%): 70 Station: -1, -2 Exam by:: Dr Cletis Media   AROM with clear abundant fluid   Labs: Lab Results  Component Value Date   WBC 8.7 12/02/2017   HGB 10.9 (L) 12/02/2017   HCT 32.0 (L) 12/02/2017   MCV 86.3 12/02/2017   PLT 185 12/02/2017    Assessment / Plan: Induction of labor progressing well Fetal Wellbeing: reassuring Anticipated MOD:  NSVD  Katharine Look A Jamiah Homeyer 12/02/2017, 10:34 AM

## 2017-12-02 NOTE — Transfer of Care (Addendum)
Immediate Anesthesia Transfer of Care Note  Patient: Alice Nash  Procedure(s) Performed: CESAREAN SECTION (N/A )  Patient Location: PACU  Anesthesia Type:Spinal  Level of Consciousness: awake, alert  and oriented  Airway & Oxygen Therapy: Patient Spontanous Breathing  Post-op Assessment: Report given to RN and Post -op Vital signs reviewed and stable  Post vital signs: Reviewed and stable  Last Vitals:  Vitals:   12/02/17 1630 12/02/17 1702  BP: (!) 145/83 (!) 147/86  Pulse: 80 79  Resp:    Temp:      Last Pain:  Vitals:   12/02/17 1600  TempSrc: Oral  PainSc:       Patients Stated Pain Goal: 6 (01/60/10 9323)  Complications: No apparent anesthesia complications

## 2017-12-02 NOTE — Progress Notes (Signed)
Alice Nash, [id number] 5 Hrs Post Op S/P Primary C/S for Fetal Intolerance to Labor  Subjective: Nurse calls to report patient with active bleeding, from incisional site, after fundal massage.  In room to assess.  Patient remains on droplet precautions.  Exhibiting pain.  Objective: Vitals:   12/02/17 1951 12/02/17 1955 12/02/17 2000 12/02/17 2001  BP:    140/73  Pulse:    67  Resp:    17  Temp:      TempSrc:      SpO2: 100% 99% 99%   Weight:      Height:         Physical Exam: -General appearance - alert, well appearing, and in no distress -Mental Status - normal mood, behavior, speech, dress, motor activity, and thought processes -Chest - not examined  -Heart - not examined  -Abdomen - Soft, Appropriately Tender, Moderate Distention.  Removed honeycomb and pressure dressing.  Pressure dressing replaced.  -Breasts - breasts appear normal, no suspicious masses, no skin or nipple changes or axillary nodes, not examined  -Extremities - pedal edema +  -Skin - normal coloration and turgor, no rashes, no suspicious skin lesions noted  Assessment 5 Hrs Post Op Hemodynamically Stable Dressing Change Pain  Plan: Plan to remove pressure dressing and replace steri strips and honey comb dressing in AM Continue to monitor bleeding as appropriate Give pain medications as ordered Dr. Chauncey Cruel. [place] to be updated on patient status   Alice Conners, MSN, CNM 12/02/2017, 9:27 PM

## 2017-12-02 NOTE — Anesthesia Preprocedure Evaluation (Addendum)
Anesthesia Evaluation  Patient identified by MRN, date of birth, ID band Patient awake    Airway Mallampati: II  TM Distance: >3 FB Neck ROM: Full    Dental no notable dental hx.    Pulmonary  Pt has the Flu and is on droplet precautions   Pulmonary exam normal breath sounds clear to auscultation       Cardiovascular hypertension, negative cardio ROS Normal cardiovascular exam Rhythm:Regular Rate:Normal     Neuro/Psych    GI/Hepatic   Endo/Other    Renal/GU      Musculoskeletal   Abdominal   Peds  Hematology   Anesthesia Other Findings   Reproductive/Obstetrics (+) Pregnancy                             Lab Results  Component Value Date   WBC 8.7 12/02/2017   HGB 10.9 (L) 12/02/2017   HCT 32.0 (L) 12/02/2017   MCV 86.3 12/02/2017   PLT 185 12/02/2017    Anesthesia Physical Anesthesia Plan  ASA: III and emergent  Anesthesia Plan: Spinal   Post-op Pain Management:    Induction:   PONV Risk Score and Plan: Treatment may vary due to age or medical condition and Ondansetron  Airway Management Planned: Mask, Natural Airway and Nasal Cannula  Additional Equipment:   Intra-op Plan:   Post-operative Plan:   Informed Consent: I have reviewed the patients History and Physical, chart, labs and discussed the procedure including the risks, benefits and alternatives for the proposed anesthesia with the patient or authorized representative who has indicated his/her understanding and acceptance.     Plan Discussed with:   Anesthesia Plan Comments:        Anesthesia Quick Evaluation

## 2017-12-02 NOTE — Op Note (Signed)
Preoperative diagnosis: Intrauterine pregnancy at 39 weeks and 3 days, chronic hypertension, +Flu, +GBS, Fetal intolerance to labor  Post operative diagnosis: Same  Anesthesia: Spinal  Anesthesiologist: Dr. Valma Cava  Procedure: Primary low transverse cesarean section  Surgeon: Dr. Katharine Look Labresha Mellor  Assistant: none  Estimated blood loss: 800 cc  Procedure:  After being informed of the planned procedure and possible complications including bleeding, infection, injury to other organs, informed consent is obtained. The patient is taken to OR #9 and given spinal anesthesia without complication. She is placed in the dorsal decubitus position with the pelvis tilted to the left. She is then prepped and draped in a sterile fashion. A Foley catheter is inserted in her bladder.  After assessing adequate level of anesthesia, we infiltrate the suprapubic area with 20 cc of Marcaine 0.25 and perform a Pfannenstiel incision which is brought down sharply to the fascia. The fascia is entered in a low transverse fashion. Linea alba is dissected. Peritoneum is entered in a midline fashion. An Alexis retractor is easily positioned.   The myometrium is then entered in a low transverse fashion, 2 cm above the vesico-uterine junction ; first with knife and then extended bluntly. Amniotic fluid is clear. We assist the birth of a female  infant in vertex presentation. Mouth and nose are suctioned. The baby is delivered. The cord is clamped and sectioned. The baby is given to the neonatologist present in the room.  10 cc of blood is drawn from the umbilical vein.The placenta is allowed to deliver spontaneously. It is complete and the cord has 3 vessels. Uterine revision is negative. We note a intramural 3 cm fibroid right anterior mid body, a 4 cm pedunculated fibroid anterior mid body and an intramural 8 cm left cornual fibroid.  We proceed with closure of the myometrium in 2 layers: First with a running locked suture of  0 Vicryl, then with a Lembert suture of 0 Vicryl imbricating the first one. Hemostasis is completed with cauterization on peritoneal edges and a figure-of-eight suture of 0-Vicryl mid incision.  Both paracolic gutters are cleaned. Both tubes and ovaries are assessed and normal. The pelvis is profusely irrigated with warm saline to confirm a satisfactory hemostasis.  Retractors and sponges are removed. Under fascia hemostasis is completed with cauterization. The fascia is then closed with 2 running sutures of 0 Vicryl meeting midline. The wound is irrigated with warm saline and hemostasis is completed with cauterization. The sub-cutaneous layer is closed with interrupted sutures of 0-Plain.The skin is closed with a subcuticular suture of 3-0 Monocryl and Steri-Strips.  Instrument and sponge count is complete x2. Estimated blood loss is 800 cc.  The procedure is well tolerated by the patient who is taken to recovery room in a well and stable condition.  female baby was born at 17:58 and received an Apgar of 9  at 1 minute and 9 at 5 minutes.    Specimen: Placenta sent to pathology   Dede Query Kalvyn Desa MD 1/24/20195:23 PM

## 2017-12-02 NOTE — Progress Notes (Signed)
Fetal tracings now with near absent variability and occasional subtle late decelerations Recommend to proceed with cesarean section Patient agreeable to proceed

## 2017-12-02 NOTE — Progress Notes (Signed)
Mollye Guinta is a 32 y.o. G1P0 at 46w3  Subjective:  Comfortable with  Labor support without medications Contractions rare after discontinuation of Pitocin    Objective: BP 137/74   Pulse 85   Temp 98 F (36.7 C) (Axillary)   Resp 18   Ht 5\' 4"  (1.626 m)   Wt 97.5 kg (215 lb)   LMP 02/20/2017   BMI 36.90 kg/m  No intake/output data recorded. No intake/output data recorded.  FHT: now with markedly decreased variability since AROM. No recurrence of late decelerations The current measures were applied: change of position, fetal scalp lead, D5%LR bolus, discontinuation of Pitocin for 45 minutes, O2  SVE:   Dilation: 5 Effacement (%): 70 Station: -1, -2 Exam by:: Raiven Belizaire  Labs: Lab Results  Component Value Date   WBC 8.7 12/02/2017   HGB 10.9 (L) 12/02/2017   HCT 32.0 (L) 12/02/2017   MCV 86.3 12/02/2017   PLT 185 12/02/2017    Assessment / Plan: 39+3 weeks with CHTN, Active Influenza infection on Tamiflu, +GBS on penicillin Fetal tracings:Category 2  Reviewed all findings with patient and husband. Recommend another labor trial with restart of Pitocin. Will monitor closely.    Cesarean section reviewed with pt with R&B including but not limited to:  bleeding, infection, injury to other organs. Low transverse approach planned which will allow vaginal delivery with future pregnancies. Should a vertical incision or inverted T be needed, patient is aware that repeat cesarean sections would be recommended in the future. Expected hospital stay and recovery also discussed.  Katharine Look A Bebe Moncure 12/02/2017, 12:54 PM

## 2017-12-02 NOTE — Progress Notes (Signed)
   Subjective:  Comfortable with  Labor support without medications Contractions every 4 minutes, lasting 40 seconds, intensity 4/10 now on Pitocin 10 mU/min    Objective: BP (!) 146/88   Pulse 88   Temp 98.8 F (37.1 C) (Oral)   Resp 18   Ht 5\' 4"  (1.626 m)   Wt 97.5 kg (215 lb)   LMP 02/20/2017   BMI 36.90 kg/m  No intake/output data recorded. No intake/output data recorded.  FHT:  Still decreased variability, no acceleration, no deceleration, + response to scalp stimulation SVE:   unchanged  Labs: Lab Results  Component Value Date   WBC 8.7 12/02/2017   HGB 10.9 (L) 12/02/2017   HCT 32.0 (L) 12/02/2017   MCV 86.3 12/02/2017   PLT 185 12/02/2017    Assessment / Plan: CHTN with + flu and +GBS now 46 hours into induction process Fetal wellbeing: category 2 tracings reviewed again with patient and [name]. Offered cesarean section: patient desires to process and differ decision Closely monitoring  Alice Nash 12/02/2017, 4:36 PM

## 2017-12-02 NOTE — Progress Notes (Signed)
Shuntel Fishburn MRN: [id number]  Subjective: -Nurse call reports expulsion of foley bulb.  States patient continues to cope well with contractions.  Chart reviewed.   Objective: BP 135/81   Pulse 81   Temp 98.7 F (37.1 C) (Axillary)   Resp 16   Ht 5\' 4"  (1.626 m)   Wt 97.5 kg (215 lb)   LMP 02/20/2017   BMI 36.90 kg/m  No intake/output data recorded. No intake/output data recorded.  Fetal Monitoring: FHT: 140 bpm, Mod Var, -Decels, -Accels UC: Q2-51min    Vaginal Exam: SVE:   Dilation: 4 Effacement (%): 50 Station: -3 Exam by:: Janann August, RN Membranes:Intact Internal Monitors: None  Augmentation/Induction: Pitocin:88mUn/min Cytotec: S/P S/P Foley Bulb  Assessment:  IUP at 39.3wks Cat I FT  IOL CHTN Influenza Bishop Score: 5  Plan: -Start pitocin titration per protocol -Plan for AROM at next VE  -Continue other mgmt as ordered -Report to be given to S. Rivard, MD  Riley Churches, CNM 12/02/2017, 6:19 AM

## 2017-12-02 NOTE — Anesthesia Procedure Notes (Signed)
Spinal  Patient location during procedure: OB Start time: 12/02/2017 5:33 PM End time: 12/02/2017 5:36 PM Staffing Anesthesiologist: Barnet Glasgow, MD Performed: anesthesiologist  Preanesthetic Checklist Completed: patient identified, surgical consent, pre-op evaluation, timeout performed, IV checked, risks and benefits discussed and monitors and equipment checked Spinal Block Patient position: sitting Prep: site prepped and draped and DuraPrep Patient monitoring: heart rate, cardiac monitor, continuous pulse ox and blood pressure Approach: midline Location: L3-4 Injection technique: single-shot Needle Needle type: Pencan  Needle gauge: 24 G Needle length: 10 cm Assessment Sensory level: T4

## 2017-12-03 ENCOUNTER — Encounter (HOSPITAL_COMMUNITY): Payer: Self-pay | Admitting: Obstetrics and Gynecology

## 2017-12-03 ENCOUNTER — Inpatient Hospital Stay (HOSPITAL_COMMUNITY): Payer: BLUE CROSS/BLUE SHIELD

## 2017-12-03 LAB — COMPREHENSIVE METABOLIC PANEL
ALT: 13 U/L — AB (ref 14–54)
ANION GAP: 8 (ref 5–15)
AST: 23 U/L (ref 15–41)
Albumin: 2.2 g/dL — ABNORMAL LOW (ref 3.5–5.0)
Alkaline Phosphatase: 89 U/L (ref 38–126)
BUN: 6 mg/dL (ref 6–20)
CHLORIDE: 102 mmol/L (ref 101–111)
CO2: 22 mmol/L (ref 22–32)
CREATININE: 0.59 mg/dL (ref 0.44–1.00)
Calcium: 8.5 mg/dL — ABNORMAL LOW (ref 8.9–10.3)
Glucose, Bld: 101 mg/dL — ABNORMAL HIGH (ref 65–99)
Potassium: 3.4 mmol/L — ABNORMAL LOW (ref 3.5–5.1)
SODIUM: 132 mmol/L — AB (ref 135–145)
Total Bilirubin: 0.4 mg/dL (ref 0.3–1.2)
Total Protein: 5.5 g/dL — ABNORMAL LOW (ref 6.5–8.1)

## 2017-12-03 LAB — CBC
HCT: 28.1 % — ABNORMAL LOW (ref 36.0–46.0)
HEMATOCRIT: 27.7 % — AB (ref 36.0–46.0)
HEMOGLOBIN: 9.4 g/dL — AB (ref 12.0–15.0)
Hemoglobin: 9.6 g/dL — ABNORMAL LOW (ref 12.0–15.0)
MCH: 29.4 pg (ref 26.0–34.0)
MCH: 29.2 pg (ref 26.0–34.0)
MCHC: 34.2 g/dL (ref 30.0–36.0)
MCHC: 33.9 g/dL (ref 30.0–36.0)
MCV: 86.2 fL (ref 78.0–100.0)
MCV: 86 fL (ref 78.0–100.0)
PLATELETS: 171 10*3/uL (ref 150–400)
Platelets: 151 10*3/uL (ref 150–400)
RBC: 3.26 MIL/uL — AB (ref 3.87–5.11)
RBC: 3.22 MIL/uL — ABNORMAL LOW (ref 3.87–5.11)
RDW: 15.1 % (ref 11.5–15.5)
RDW: 15 % (ref 11.5–15.5)
WBC: 9.7 10*3/uL (ref 4.0–10.5)
WBC: 8.5 10*3/uL (ref 4.0–10.5)

## 2017-12-03 MED ORDER — LACTATED RINGERS IV BOLUS (SEPSIS): 500.0000 mL | Freq: Once | INTRAVENOUS | Status: DC

## 2017-12-03 NOTE — Progress Notes (Signed)
Dr. Charlesetta Garibaldi notified of elevated blood pressures of 146/84 and 149/73.  Confirmed only notify for blood pressure greater than 160/100 per orders due to two different orders being in computer.

## 2017-12-03 NOTE — Lactation Note (Signed)
This note was copied from a baby's chart. Lactation Consultation Note  Patient Name: Alice Nash HDIXB'O Date: 12/03/2017 Reason for consult: Initial assessment;Term Breastfeeding consultation services and support information given.  Mom is positive for influenza and has taken Tami Flu.  Baby is being cared for by grandmother in the next room.  Mom is pumping every 3 hours.  She first obtained 30 mls and nothing since.  Reassured.  Flange size changed to 30 mm for a better fit.  Instructed to pump 8-12 times in 24 hours.  Maternal Data    Feeding Feeding Type: Formula  LATCH Score                   Interventions    Lactation Tools Discussed/Used Pump Review: Setup, frequency, and cleaning;Milk Storage Initiated by:: RN Date initiated:: 12/02/17   Consult Status Consult Status: Follow-up Date: 12/04/17 Follow-up type: In-patient    Ave Filter 12/03/2017, 2:29 PM

## 2017-12-03 NOTE — Progress Notes (Signed)
Alice Nash [id number] Postpartum Day 1 S/P Primary Cesarean Section due to Fetal Intolerance to Labor  Subjective: Patient up ad lib, denies syncope or dizziness. Further denies fever, chills, and cough. Reports consuming regular diet without issues, but experiencing N/V this am with relief after one incident of emesis.  Zofran given. Patient reports no bowel movement and is not passing flatus.  Foley catheter remains in place. Patient is pumping and reports going well.  Desires for postpartum contraception not addressed.  Pain is being appropriately managed with use of toradol.   Objective: Temp:  [97.7 F (36.5 C)-98.8 F (37.1 C)] 97.7 F (36.5 C) (01/25 0300) Pulse Rate:  [67-96] 78 (01/25 0300) Resp:  [16-18] 16 (01/25 0300) BP: (106-157)/(66-94) 138/82 (01/25 0300) SpO2:  [94 %-100 %] 94 % (01/25 0445)  Recent Labs    11/30/17 1818 12/02/17 0831 12/02/17 2320  HGB 11.1* 10.9* 9.6*  HCT 32.7* 32.0* 28.1*  WBC 8.0 8.7 9.7    Physical Exam:  General: alert, cooperative and no distress Mood/Affect: Appropriate/Bright Lungs: clear to auscultation, no wheezes, rales or rhonchi, symmetric air entry.  Heart: normal rate and regular rhythm. Breast: not examined. Abdomen:  + bowel sounds, Soft, Moderate Distention, Appropriately Tender Incision: healing well, Honeycomb dressing and steri strips replaced Uterine Fundus: firm Lochia: appropriate Skin: Warm, Dry. DVT Evaluation: No evidence of DVT seen on physical exam. No significant calf/ankle edema. JP drain:   None  Assessment Post Operative Day 1 S/P Primary C/S Normal Involution BreastFeeding via Pumping Hemodynamically Stable Influenza  Plan: -Discussed progression of movement throughout the day including removal of catheter and up to bathroom -Discussed pain management -Discussed incisional care -Discussed bowel function -Discussed discharge--which is to occur Monday Jan 28 after completion of tamiflu per  Dr. Chauncey Cruel. Rivard -No q/c -Continue other mgmt as ordered -Dr. N.Dillard to be updated on patient status  Maryann Conners MSN, CNM 12/03/2017, 6:31 AM

## 2017-12-03 NOTE — Progress Notes (Signed)
RN notified Dr. Charlesetta Garibaldi of patient's output decreasing to 150 over 4 hours and continuing to be amber in color.  Per Dr. Charlesetta Garibaldi will monitor for another hour and call MD on call back if output still low.

## 2017-12-03 NOTE — Progress Notes (Signed)
At 1740 Dr. Simona Huh was notified of 38ml concentrated urine output in an hour and a half and patient's continued vomiting.  Verbal order for 500cc bolus over an hour taken.  Dr. Simona Huh request call back if patient does not have increased urine output over the hour.

## 2017-12-03 NOTE — Anesthesia Postprocedure Evaluation (Signed)
Anesthesia Post Note  Patient: Alice Nash  Procedure(s) Performed: CESAREAN SECTION (N/A )     Patient location during evaluation: Mother Baby Anesthesia Type: Spinal Level of consciousness: awake Pain management: pain level controlled Vital Signs Assessment: post-procedure vital signs reviewed and stable Respiratory status: spontaneous breathing Cardiovascular status: stable Postop Assessment: spinal receding and patient able to bend at knees Anesthetic complications: no    Last Vitals:  Vitals:   12/03/17 0445 12/03/17 0730  BP:  (!) 146/84  Pulse:  72  Resp:  16  Temp:  36.8 C  SpO2: 94% 99%    Last Pain:  Vitals:   12/03/17 0730  TempSrc: Oral  PainSc: 0-No pain   Pain Goal: Patients Stated Pain Goal: 6 (12/01/17 6815)               Everette Rank

## 2017-12-04 ENCOUNTER — Inpatient Hospital Stay (HOSPITAL_COMMUNITY): Payer: BLUE CROSS/BLUE SHIELD

## 2017-12-04 DIAGNOSIS — K567 Ileus, unspecified: Secondary | ICD-10-CM | POA: Diagnosis not present

## 2017-12-04 DIAGNOSIS — Z98891 History of uterine scar from previous surgery: Secondary | ICD-10-CM

## 2017-12-04 HISTORY — DX: History of uterine scar from previous surgery: Z98.891

## 2017-12-04 HISTORY — DX: Ileus, unspecified: K56.7

## 2017-12-04 MED ORDER — KETOROLAC TROMETHAMINE 30 MG/ML IJ SOLN
30.0000 mg | Freq: Four times a day (QID) | INTRAMUSCULAR | Status: DC | PRN
  Administered 2017-12-04 – 2017-12-05 (×5): 30 mg via INTRAVENOUS
  Filled 2017-12-04 (×5): qty 1

## 2017-12-04 MED ORDER — HYDROMORPHONE HCL 1 MG/ML IJ SOLN: 1.0000 mg | INTRAMUSCULAR | Status: DC | PRN

## 2017-12-04 MED ORDER — ONDANSETRON HCL 4 MG/2ML IJ SOLN
4.0000 mg | Freq: Four times a day (QID) | INTRAMUSCULAR | Status: AC
  Administered 2017-12-04 – 2017-12-05 (×4): 4 mg via INTRAVENOUS
  Filled 2017-12-04 (×4): qty 2

## 2017-12-04 MED ORDER — METOCLOPRAMIDE HCL 5 MG/ML IJ SOLN
10.0000 mg | Freq: Three times a day (TID) | INTRAMUSCULAR | Status: DC
  Administered 2017-12-04 (×2): 10 mg via INTRAVENOUS
  Filled 2017-12-04 (×3): qty 2

## 2017-12-04 NOTE — Progress Notes (Signed)
Patient's abdomen soft. Patient states she is not passing gas . Patient stated she was told by personnel that she could walk in halls just with a mask. Patient has not ambulate at all today. Patient told she needs to walk the hall granted she is not nauseated. She said she would walk. Pain controlled.

## 2017-12-04 NOTE — Progress Notes (Signed)
Parents updated on status of their baby next door. Emesis was not green  It was yellow per dad.

## 2017-12-04 NOTE — Lactation Note (Signed)
This note was copied from a baby's chart. Lactation Consultation Note  Patient Name: Alice Nash Today's Date: 12/04/2017   Mom still has not seen baby, she's been kept away for baby due to the flu. Mom is pumping and grandma is offering mother's milk to baby; baby HAS NOT been put at the breast yet, but has been supplemented mother's milk. Nurse will call lactation PRN  Consult Status      Annelie Boak Francene Boyers 12/04/2017, 5:53 PM

## 2017-12-04 NOTE — Progress Notes (Signed)
Mom updated on baby status. Mom got up and walked hallway with a mask on. No flatus yet. Patient denies nausea. No further emesis.

## 2017-12-04 NOTE — Progress Notes (Signed)
Patient had x1 emesis. Patient stated she feels better after emesis. Reglan given. Patient told to call out if she still feels nauseated and wants more Nausea meds. Scds on. Patient states she is voiding fine and denies pain while in bed. Pain meds offered if needed and told to call out when she needed them. Plan of care explained.

## 2017-12-04 NOTE — Progress Notes (Signed)
In to check on patient status.  She has been ambulating in room, denies N/V since this am.  Received Reglan 10 mg IV at 0614, Zofran 4 mg at 1102. Received Toradol 30 mg IB at 1102. IV LR running at 125 cc/hr.  Denies pain. No flatus yet, but "my stomach is growling".  Per consult with Dr. Landry Mellow, will maintain NPO x 24 hours, then re-evaluate in am.  Continue current care.  Donnel Saxon, CNM 12/04/17 1:30p

## 2017-12-04 NOTE — Progress Notes (Signed)
Alice Nash [id number]  Subjective: Postpartum Day 2: Primary C/S due to fetal intolerance to labor, induced due to chronic HTN with exacerbation of BPs on day of admission, positive flu testing. Patient with N/V since last evening.  Denies abdominal pain other than postsurgical incisional pain. Patient up in early evening, no dizziness. Foley maintained overnight due to decreased urine output Feeding:  Pumping due to droplet precautions. Contraceptive plan:  Undecided  Baby being cared for in another room by patient's mother.  Dx with ileus on abdominal xray 0630 today.   Objective: Temp:  [97.9 F (36.6 C)-99.1 F (37.3 C)] 98.8 F (37.1 C) (01/26 0616) Pulse Rate:  [78-87] 79 (01/26 0616) Resp:  [16-18] 18 (01/26 0616) BP: (128-160)/(77-88) 159/81 (01/26 0700) SpO2:  [98 %-100 %] 98 % (01/25 1830)   Vitals:   12/03/17 1530 12/03/17 1830 12/04/17 0616 12/04/17 0700  BP: (!) 141/88 128/77 (!) 160/83 (!) 159/81  Pulse: 84 87 79   Resp: 16 16 18    Temp: 98.4 F (36.9 C) 99.1 F (37.3 C) 98.8 F (37.1 C)   TempSrc: Oral Oral Oral   SpO2: 99% 98%    Weight:      Height:       BP through last 12 hours--128/77, 160/83, 159/81  Received Hydralazine 10 mg at 0637 due to BP 160/83.  CBC Latest Ref Rng & Units 12/03/2017 12/02/2017 12/02/2017  WBC 4.0 - 10.5 K/uL 8.5 9.7 8.7  Hemoglobin 12.0 - 15.0 g/dL 9.4(L) 9.6(L) 10.9(L)  Hematocrit 36.0 - 46.0 % 27.7(L) 28.1(L) 32.0(L)  Platelets 150 - 400 K/uL 151 171 185   CMP Latest Ref Rng & Units 12/03/2017 12/02/2017 11/30/2017  Glucose 65 - 99 mg/dL 101(H) - 96  BUN 6 - 20 mg/dL 6 - 10  Creatinine 0.44 - 1.00 mg/dL 0.59 0.52 0.54  Sodium 135 - 145 mmol/L 132(L) - 132(L)  Potassium 3.5 - 5.1 mmol/L 3.4(L) - 4.0  Chloride 101 - 111 mmol/L 102 - 103  CO2 22 - 32 mmol/L 22 - 19(L)  Calcium 8.9 - 10.3 mg/dL 8.5(L) - 9.6  Total Protein 6.5 - 8.1 g/dL 5.5(L) - 7.0  Total Bilirubin 0.3 - 1.2 mg/dL 0.4 - 0.6  Alkaline Phos 38 - 126  U/L 89 - 126  AST 15 - 41 U/L 23 - 20  ALT 14 - 54 U/L 13(L) - 14   I/O balance last shift:  - 1000 600 cc urine through night via foley, 400 cc since 7 am, urine dark yellow.   Physical Exam:  General: alert, nauseous Lochia: appropriate Uterine Fundus: firm Abdomen:  ? Very faint bowel sounds in lower quadrants, none in upper quad. Mild gaseous distension noted, NT Incision: Honeycomb dressing CDI DVT Evaluation: No evidence of DVT seen on physical exam. Negative Homan's sign.  Receiving Lovenox SQ prophylactically daily  Assessment/Plan: Status post cesarean delivery, day 2--fetal intolerance to labor, positive flu testing. Ileus Consulted with Dr. Landry Mellow NPO  Antiemetics q 6 hours x 24 hours Reglan q 8 hours. Toradol and Dilaudid IV for pain Hold Tamiflu today--has completed 6/10 doses Repeat BMP in am. D/c foley, ambulate.  Continue current care. Reassess prn.    Donnel Saxon MSN, CNM 12/04/2017, 8:25 AM

## 2017-12-05 DIAGNOSIS — J101 Influenza due to other identified influenza virus with other respiratory manifestations: Secondary | ICD-10-CM

## 2017-12-05 HISTORY — DX: Influenza due to other identified influenza virus with other respiratory manifestations: J10.1

## 2017-12-05 LAB — BASIC METABOLIC PANEL
ANION GAP: 8 (ref 5–15)
BUN: 12 mg/dL (ref 6–20)
CO2: 22 mmol/L (ref 22–32)
Calcium: 8 mg/dL — ABNORMAL LOW (ref 8.9–10.3)
Chloride: 106 mmol/L (ref 101–111)
Creatinine, Ser: 0.57 mg/dL (ref 0.44–1.00)
Glucose, Bld: 88 mg/dL (ref 65–99)
POTASSIUM: 3.7 mmol/L (ref 3.5–5.1)
SODIUM: 136 mmol/L (ref 135–145)

## 2017-12-05 MED ORDER — ONDANSETRON HCL 4 MG/2ML IJ SOLN: 4.0000 mg | Freq: Four times a day (QID) | INTRAMUSCULAR | Status: DC | PRN

## 2017-12-05 MED ORDER — NIFEDIPINE ER OSMOTIC RELEASE 30 MG PO TB24
30.0000 mg | ORAL_TABLET | Freq: Every day | ORAL | Status: DC
  Administered 2017-12-05 – 2017-12-06 (×2): 30 mg via ORAL
  Filled 2017-12-05 (×4): qty 1

## 2017-12-05 NOTE — Progress Notes (Signed)
Noted Tamiflu order to be discontinued  Although the order stated 3 remaining doses.  Notified Irene Shipper CNM to get clarity on order. Told to keep Tamiflu discontinued and to not give it to the patient.  Patient still on droplet precautions. Pt doesn't have any complaints at this time.

## 2017-12-05 NOTE — Progress Notes (Signed)
Mom told to pump every 3 to 4 hours and we can take it to her infant next door.

## 2017-12-05 NOTE — Progress Notes (Signed)
Elder Negus updated on patient's status and that she did not want to take her procardia until she ate or drank something. Elder Negus was ok holding the procardia. V.L. Aware that patient did not want her lovenox this am and was told to ambulate halls today. SCDs encouraged to stay on patient while in room. Patient updated on baby status. Patient now has hyperactive bowel sounds. Negative flatus per patient. Patient educated on how important it is to walk halls today.

## 2017-12-05 NOTE — Discharge Summary (Addendum)
OB Discharge Summary     Patient Name: Alice Nash DOB: [birth year] MRN: [id number]  Date of admission: 11/30/2017 Delivering MD: Delsa Bern   Date of discharge: 12/06/2017  Admitting diagnosis: 39.1wks High BP Reactive stress test PIH Intrauterine pregnancy: [redacted]w[redacted]d     Secondary diagnosis:  Principal Problem:   Status post primary low transverse cesarean section Active Problems:   Chronic benign essential hypertension in third trimester   Ileus ([place])   Influenza A  Additional problems: NA     Discharge diagnosis: Term Pregnancy Delivered, CHTN and ileus                                                                                                Post partum procedures:NA  Augmentation: AROM, Pitocin, Cytotec and Foley Balloon  Complications: None  Hospital course:  Induction of Labor With Cesarean Section  32 y.o. yo G1P0 at [redacted]w[redacted]d was admitted to the hospital 11/30/2017 for induction of labor due to chronic HTN with exacerbation of BP. Patient had a labor course significant for prolonged labor, positive dx of Influenza A, positive GBS, and fetal intolerance to labor. The patient went for cesarean section due to Non-Reassuring FHR, and delivered a Viable infant on 12/02/17 by Dr. Cletis Media.   Membrane Rupture Time/Date: )10:29 AM ,12/02/2017    Patient remained on droplet isolation during her pp stay, due to positive Influenza A.  Baby was maintained in a separate room, cared for by patient's mother.  Patient pumped for breastmilk.  She was placed on Tamiflu on the day of admission.  She completed 8 doses during her hospitalization, and was d/c'd home with 2 additional doses to complete the full 10 day course.  Details of operation can be found in separate operative Note.    Patient's postoperative course was complicated by N/V on day 1 and dx of ileus on day 2.  She was placed on NPO status, with ambulation encouraged, Reglan and Zofran administration. On day 3, she began to  have flatus and bowel sounds, and her diet was advanced with tolerance.  She was begun on Procardia 30 mg XL daily on day 3, when po intake was restarted.  She is ambulating, tolerating a regular diet, passing flatus, and urinating well.  Patient is discharged home in stable condition on 12/06/17.   Patient lives in [place], so will be seen in the office later this week for BP check.            Rx for Proctofoam [facility] was also provided at d/c.  Rx for Oxycodone 5 mg po, #10, no refills, given in written form for patient to fill if needed.                     Guidelines on minimizing baby's exposure will be provided by the pediatrician.   Physical exam  Vitals:   12/04/17 1300 12/05/17 0600 12/05/17 1656 12/06/17 0531  BP: (!) 157/78 (!) 153/73 138/88 (!) 148/81  Pulse: 84 74 69 67  Resp:  18 20 18   Temp:  99 F (37.2 C)  97.7 F (36.5 C) 98.1 F (36.7 C)  TempSrc:  Oral Axillary Oral  SpO2:   100%   Weight:      Height:       General: alert Lochia: appropriate Uterine Fundus: firm Incision: Healing well with no significant drainage--Honeycomb dressing CDI. DVT Evaluation: No evidence of DVT seen on physical exam. Negative Homan's sign.   Labs: CBC Latest Ref Rng & Units 12/03/2017 12/02/2017 12/02/2017  WBC 4.0 - 10.5 K/uL 8.5 9.7 8.7  Hemoglobin 12.0 - 15.0 g/dL 9.4(L) 9.6(L) 10.9(L)  Hematocrit 36.0 - 46.0 % 27.7(L) 28.1(L) 32.0(L)  Platelets 150 - 400 K/uL 151 171 185   CMP Latest Ref Rng & Units 12/05/2017  Glucose 65 - 99 mg/dL 88  BUN 6 - 20 mg/dL 12  Creatinine 0.44 - 1.00 mg/dL 0.57  Sodium 135 - 145 mmol/L 136  Potassium 3.5 - 5.1 mmol/L 3.7  Chloride 101 - 111 mmol/L 106  CO2 22 - 32 mmol/L 22  Calcium 8.9 - 10.3 mg/dL 8.0(L)  Total Protein 6.5 - 8.1 g/dL -  Total Bilirubin 0.3 - 1.2 mg/dL -  Alkaline Phos 38 - 126 U/L -  AST 15 - 41 U/L -  ALT 14 - 54 U/L -    Discharge instruction: per After Visit Summary and "Baby and Me Booklet".  After visit  meds:  Allergies as of 12/06/2017   No Known Allergies     Medication List    TAKE these medications   hydrocortisone-pramoxine rectal foam Commonly known as:  PROCTOFOAM HC Place 1 applicator rectally 2 (two) times daily.   ibuprofen 600 MG tablet Commonly known as:  ADVIL,MOTRIN Take 1 tablet (600 mg total) by mouth every 6 (six) hours as needed.   NIFEdipine 30 MG 24 hr tablet Commonly known as:  PROCARDIA-XL/ADALAT CC Take 1 tablet (30 mg total) by mouth daily.   oseltamivir 75 MG capsule Commonly known as:  TAMIFLU Take 1 capsule (75 mg total) by mouth 2 (two) times daily for 2 days.   oxyCODONE 5 MG immediate release tablet Commonly known as:  ROXICODONE Take 1 tablet (5 mg total) by mouth every 4 (four) hours as needed.   prenatal multivitamin Tabs tablet Take 1 tablet by mouth daily at 12 noon.       Diet: routine diet  Activity: Advance as tolerated. Pelvic rest for 6 weeks.   Outpatient follow up:2 weeks Follow up Appt:No future appointments. Follow up Visit:No Follow-up on file.  Postpartum contraception: Undecided--will discuss at office visit this week.  Newborn Data: Live born female  Birth Weight: 8 lb 0.4 oz (3640 g) APGAR: 9, 9  Newborn Delivery   Birth date/time:  12/02/2017 17:58:00 Delivery type:  C-Section, Low Transverse C-section categorization:  Primary     Baby Feeding: Breast Disposition:home with mother   12/06/2017 Donnel Saxon, CNM

## 2017-12-05 NOTE — Discharge Instructions (Signed)
Influenza, Adult Influenza, more commonly known as the flu, is a viral infection that primarily affects the respiratory tract. The respiratory tract includes organs that help you breathe, such as the lungs, nose, and throat. The flu causes many common cold symptoms, as well as a high fever and body aches. The flu spreads easily from person to person (is contagious). Getting a flu shot (influenza vaccination) every year is the best way to prevent influenza. What are the causes? Influenza is caused by a virus. You can catch the virus by:  Breathing in droplets from an infected person's cough or sneeze.  Touching something that was recently contaminated with the virus and then touching your mouth, nose, or eyes.  What increases the risk? The following factors may make you more likely to get the flu:  Not cleaning your hands frequently with soap and water or alcohol-based hand sanitizer.  Having close contact with many people during cold and flu season.  Touching your mouth, eyes, or nose without washing or sanitizing your hands first.  Not drinking enough fluids or not eating a healthy diet.  Not getting enough sleep or exercise.  Being under a high amount of stress.  Not getting a yearly (annual) flu shot.  You may be at a higher risk of complications from the flu, such as a severe lung infection (pneumonia), if you:  Are over the age of 41.  Are pregnant.  Have a weakened disease-fighting system (immune system). You may have a weakened immune system if you: ? Have HIV or AIDS. ? Are undergoing chemotherapy. ? Aretaking medicines that reduce the activity of (suppress) the immune system.  Have a long-term (chronic) illness, such as heart disease, kidney disease, diabetes, or lung disease.  Have a liver disorder.  Are obese.  Have anemia.  What are the signs or symptoms? Symptoms of this condition typically last 4-10 days and may  include:  Fever.  Chills.  Headache, body aches, or muscle aches.  Sore throat.  Cough.  Runny or congested nose.  Chest discomfort and cough.  Poor appetite.  Weakness or tiredness (fatigue).  Dizziness.  Nausea or vomiting.  How is this diagnosed? This condition may be diagnosed based on your medical history and a physical exam. Your health care provider may do a nose or throat swab test to confirm the diagnosis. How is this treated? If influenza is detected early, you can be treated with antiviral medicine that can reduce the length of your illness and the severity of your symptoms. This medicine may be given by mouth (orally) or through an IV tube that is inserted in one of your veins. The goal of treatment is to relieve symptoms by taking care of yourself at home. This may include taking over-the-counter medicines, drinking plenty of fluids, and adding humidity to the air in your home. In some cases, influenza goes away on its own. Severe influenza or complications from influenza may be treated in a hospital. Follow these instructions at home:  Take over-the-counter and prescription medicines only as told by your health care provider.  Use a cool mist humidifier to add humidity to the air in your home. This can make breathing easier.  Rest as needed.  Drink enough fluid to keep your urine clear or pale yellow.  Cover your mouth and nose when you cough or sneeze.  Wash your hands with soap and water often, especially after you cough or sneeze. If soap and water are not available, use  hand sanitizer.  Stay home from work or school as told by your health care provider. Unless you are visiting your health care provider, try to avoid leaving home until your fever has been gone for 24 hours without the use of medicine.  Keep all follow-up visits as told by your health care provider. This is important. How is this prevented?  Getting an annual flu shot is the best way  to avoid getting the flu. You may get the flu shot in late summer, fall, or winter. Ask your health care provider when you should get your flu shot.  Wash your hands often or use hand sanitizer often.  Avoid contact with people who are sick during cold and flu season.  Eat a healthy diet, drink plenty of fluids, get enough sleep, and exercise regularly. Contact a health care provider if:  You develop new symptoms.  You have: ? Chest pain. ? Diarrhea. ? A fever.  Your cough gets worse.  You produce more mucus.  You feel nauseous or you vomit. Get help right away if:  You develop shortness of breath or difficulty breathing.  Your skin or nails turn a bluish color.  You have severe pain or stiffness in your neck.  You develop a sudden headache or sudden pain in your face or ear.  You cannot stop vomiting. This information is not intended to replace advice given to you by your health care provider. Make sure you discuss any questions you have with your health care provider. Document Released: 10/23/2000 Document Revised: 04/02/2016 Document Reviewed: 08/20/2015 Elsevier Interactive Patient Education  2017 [employer].   Postpartum Care After Cesarean Delivery The period of time right after you deliver your newborn is called the postpartum period. What kind of medical care will I receive?  You may continue to receive fluids and medicines through an IV tube inserted into one of your veins.  You may have small, flexible tube (catheter) draining urine from your bladder into a bag outside of your body. The catheter will be removed as soon as possible.  You may be given a squirt bottle to use when you go to the bathroom. You may use this until you are comfortable wiping as usual. To use the squirt bottle, follow these steps: ? Before you urinate, fill the squirt bottle with warm water. The water should be warm. Do not use hot water. ? After you urinate, while you are sitting on  the toilet, use the squirt bottle to rinse the area around your urethra and vaginal opening. This rinses away any urine and blood. ? You may do this instead of wiping. As you start healing, you may use the squirt bottle before wiping yourself. Make sure to wipe gently. ? Fill the squirt bottle with clean water every time you use the bathroom.  You will be given sanitary pads to wear.  Your incision will be monitored to make sure it is healing properly. You will be told when it is safe for your stitches, staples, or skin adhesive tape to be removed. What can I expect?  You may not feel the need to urinate for several hours after delivery.  You will have some soreness and pain in your abdomen. You may have a small amount of blood or clear fluid coming from your incision.  If you are breastfeeding, you may have uterine contractions every time you breastfeed for up to several weeks postpartum. Uterine contractions help your uterus return to its normal size.  It is normal to have vaginal bleeding (lochia) after delivery. The amount and appearance of lochia is often similar to a menstrual period in the first week after delivery. It will gradually decrease over the next few weeks to a dry, yellow-brown discharge. For most women, lochia stops completely by 6-8 weeks after delivery. Vaginal bleeding can vary from woman to woman.  Within the first few days after delivery, you may have breast engorgement. This is when your breasts feel heavy, full, and uncomfortable. Your breasts may also throb and feel hard, tightly stretched, warm, and tender. After this occurs, you may have milk leaking from your breasts.Your health care provider can help you relieve discomfort due to breast engorgement. Breast engorgement should go away within a few days.  You may feel more sad or worried than normal due to hormonal changes after delivery. These feelings should not last more than a few days. If these feelings do not go  away after several days, speak with your health care provider. How should I care for myself?  Tell your health care provider if you have pain or discomfort.  Drink enough water to keep your urine clear or pale yellow.  Wash your hands thoroughly with soap and water for at least 20 seconds after changing your sanitary pads or using the toilet, and before holding or feeding your baby.  If you are not breastfeeding, avoid touching your breasts a lot. Doing this can make your breasts produce more milk.  If you become weak or lightheaded, or you feel like you might faint, ask for help before: ? Getting out of bed. ? Showering.  Change your sanitary pads frequently. Watch for any changes in your flow, such as a sudden increase in volume, a change in color, or the passing of large blood clots. If you pass a blood clot from your vagina, save it to show to your health care provider. Do not flush blood clots down the toilet without having your health care provider look at them.  Make sure that all your vaccinations are up to date. This can help protect you and your baby from getting certain diseases. You may need to have immunizations done before you leave the hospital.  If desired, talk with your health care provider about methods of family planning or birth control (contraception). How can I start bonding with my baby? Spending as much time as possible with your baby is very important. During this time, you and your baby can get to know each other and develop a bond. Having your baby stay with you in your room (rooming in) can give you time to get to know your baby. Rooming in can also help you become comfortable caring for your baby. Breastfeeding can also help you bond with your baby. How can I plan for returning home with my baby?  Make sure that you have a car seat installed in your vehicle. ? Your car seat should be checked by a certified car seat installer to make sure that it is installed  safely. ? Make sure that your baby fits into the car seat safely.  Ask your health care provider any questions you have about caring for yourself or your baby. Make sure that you are able to contact your health care provider with any questions after leaving the hospital. This information is not intended to replace advice given to you by your health care provider. Make sure you discuss any questions you have with your health care provider. Document  Released: 07/20/2012 Document Revised: 03/30/2016 Document Reviewed: 09/30/2015 Elsevier Interactive Patient Education  [employer].

## 2017-12-05 NOTE — Progress Notes (Signed)
Checked on patient status. Denies N/V, has ambulated in hall, positive flatus. Has tolerated clear liquid diet without N/V. Feels ready to try a regular/bland diet.  Will advance diet to regular. Continue to observe. Resume po meds.  Donnel Saxon, CNM 12/05/17 4:45p

## 2017-12-05 NOTE — Progress Notes (Addendum)
Alice Nash [id number] Pt seen and examined by Dr. Simona Huh.  Agree with note. Subjective: Postpartum Day 3: Primary C/S due to fetal intolerance to labor, induced due to chronic HTN with exacerbation of BPs on day of admission, positive flu testing, ileus dx 1/25. Patient up ad lib, reports no syncope or dizziness.  Denies HA, visual sx, or epigastric pain.   No flatus yet, but no N/V since 3pm yesterday.  Had single episode vomiting just after Reglan dose at 1431, and patient has declined Reglan since. Has ambulated in halls x 2 yesterday, up ad lib in room. On Zofran with benefit. Feeding:  Pumping due to maternal isolation due to positive flu testing on admission. Contraceptive plan:  Undecided.  Completed 6 doses of Tamiflu before NPO.  Objective: Temp:  [99 F (37.2 C)] 99 F (37.2 C) (01/27 0600) Pulse Rate:  [74-84] 74 (01/27 0600) Resp:  [18] 18 (01/27 0600) BP: (153-159)/(73-81) 153/73 (01/27 0600)   Vitals:   12/04/17 0700 12/04/17 0900 12/04/17 1300 12/05/17 0600  BP: (!) 159/81 (!) 159/81 (!) 157/78 (!) 153/73  Pulse:   84 74  Resp:    18  Temp:    99 F (37.2 C)  TempSrc:    Oral  SpO2:      Weight:      Height:       Urine output 1175 last 24 hours, -925 I/O balance.  CBC Latest Ref Rng & Units 12/03/2017 12/02/2017 12/02/2017  WBC 4.0 - 10.5 K/uL 8.5 9.7 8.7  Hemoglobin 12.0 - 15.0 g/dL 9.4(L) 9.6(L) 10.9(L)  Hematocrit 36.0 - 46.0 % 27.7(L) 28.1(L) 32.0(L)  Platelets 150 - 400 K/uL 151 171 185   BMP Latest Ref Rng & Units 12/05/2017 12/03/2017 12/02/2017  Glucose 65 - 99 mg/dL 88 101(H) -  BUN 6 - 20 mg/dL 12 6 -  Creatinine 0.44 - 1.00 mg/dL 0.57 0.59 0.52  Sodium 135 - 145 mmol/L 136 132(L) -  Potassium 3.5 - 5.1 mmol/L 3.7 3.4(L) -  Chloride 101 - 111 mmol/L 106 102 -  CO2 22 - 32 mmol/L 22 22 -  Calcium 8.9 - 10.3 mg/dL 8.0(L) 8.5(L) -    Physical Exam:  General: alert Lochia: appropriate Uterine Fundus: firm Abdomen:  + bowel sounds in lower  quadrants, mild gaseous distension, but soft Incision: Honeycomb dressing CDI DVT Evaluation: No evidence of DVT seen on physical exam. Negative Homan's sign.   Assessment/Plan: Status post cesarean delivery, day 3--hx chronic HTN, ileus, positive flu testing. Improving bowel sounds Consulted with Dr. Simona Huh Will advance to clear liquids, and encourage more ambulation. Will advance to regular once patient passing flatus. Procardia 30 mg XL daily.   Donnel Saxon MSN, CNM 12/05/2017, 8:45 AM

## 2017-12-05 NOTE — Progress Notes (Signed)
Patient up eating Jello . She is smiling and seems to look a lot better.

## 2017-12-06 ENCOUNTER — Encounter (HOSPITAL_COMMUNITY): Payer: Self-pay | Admitting: *Deleted

## 2017-12-06 MED ORDER — IBUPROFEN 600 MG PO TABS: 600.0000 mg | ORAL_TABLET | Freq: Four times a day (QID) | ORAL | 2 refills | Status: DC | PRN

## 2017-12-06 MED ORDER — OXYCODONE HCL 5 MG PO TABS: 5.0000 mg | ORAL_TABLET | ORAL | 0 refills | Status: DC | PRN

## 2017-12-06 MED ORDER — NIFEDIPINE ER 30 MG PO TB24: 30.0000 mg | ORAL_TABLET | Freq: Every day | ORAL | 2 refills | Status: DC

## 2017-12-06 MED ORDER — OSELTAMIVIR PHOSPHATE 75 MG PO CAPS: 75.0000 mg | ORAL_CAPSULE | Freq: Two times a day (BID) | ORAL | 0 refills | Status: AC

## 2017-12-06 MED ORDER — HYDROCORTISONE ACE-PRAMOXINE 1-1 % RE FOAM: 1.0000 | Freq: Two times a day (BID) | RECTAL | 2 refills | Status: DC

## 2017-12-06 NOTE — Lactation Note (Signed)
This note was copied from a baby's chart. Lactation Consultation Note  Patient Name: Alice Nash [id number] Date: 12/06/2017 Reason for consult: Follow-up assessment;Infant weight loss;Term  Today is the 1st mom has been able to see baby since brief time after delivery  Due to tx for the Flu ( tested positive).  LC assisted mom to latch and 1st reviewed hand expressing, several drops of  Colostrum. Noted. Baby opened wide and was unable to to obtain depth due to the size  Of moms nipple.  LC mentioned to mom baby consistent pumping both breast around the clock  And applying coconut oil to the nipples prior to pumping will soften tissue and add moistness.  Nipples are dry, and semi compressible. LC showed mom where the baby's mouth should  Be positioned when properly latched.  Per mom has pumped 4-5 times in the last 24 hours. LC encouraged mom to increase to  6 today and then work on up to 8 by tomorrow.  Sore nipple and engorgement prevention and tx reviewed.  Per mom will have a DEBP at home ( [place] ) and LC provided 2 - #36 flanges for the milk comes  In in case she needs them.  LC reassured mom the coconut oil is going to help soften up the tissue.  LC offered  And recommended LC O/P appt for next week to give the milk a change to come in  And the nipple to elongate for the baby's mouth to fit. Mom receptive. LC requested the [facility] clinic to  Call mom in [place] for the Clinic. Mom aware the clinic will be calling.  Mother informed of post-discharge support and given phone number to the lactation department, including services for phone call assistance; out-patient appointments; and breastfeeding support group. List of other breastfeeding resources in the community given in the handout. Encouraged mother to call for problems or concerns related to breastfeeding.   Maternal Data Has patient been taught Hand Expression?: Yes Does the patient have breastfeeding experience prior  to this delivery?: No  Feeding Feeding Type: Formula Nipple Type: Slow - flow  LATCH Score Latch: Repeated attempts needed to sustain latch, nipple held in mouth throughout feeding, stimulation needed to elicit sucking reflex.  Audible Swallowing: None  Type of Nipple: Everted at rest and after stimulation  Comfort (Breast/Nipple): Soft / non-tender  Hold (Positioning): Assistance needed to correctly position infant at breast and maintain latch.  LATCH Score: 6  Interventions Interventions: Breast feeding basics reviewed  Lactation Tools Discussed/Used Tools: Pump;Flanges Flange Size: 30;36;Other (comment)(LC provided 2 - #36's flanges for when the milk comes in if needed ) Breast pump type: Double-Electric Breast Pump   Consult Status Consult Status: Follow-up Date: ([place] reequested an [place] O/P appt for next Monday or Tuesday due to DL ) Follow-up type: [place] 12/06/2017, 11:44 AM

## 2017-12-08 DIAGNOSIS — R600 Localized edema: Secondary | ICD-10-CM | POA: Diagnosis not present

## 2017-12-08 DIAGNOSIS — Z308 Encounter for other contraceptive management: Secondary | ICD-10-CM | POA: Diagnosis not present

## 2017-12-10 ENCOUNTER — Inpatient Hospital Stay (HOSPITAL_COMMUNITY): Payer: BLUE CROSS/BLUE SHIELD | Admitting: Anesthesiology

## 2017-12-10 ENCOUNTER — Encounter (HOSPITAL_COMMUNITY): Payer: Self-pay

## 2017-12-10 ENCOUNTER — Encounter (HOSPITAL_COMMUNITY)
Admission: AD | Disposition: A | Discharge status: Home / Self Care | Payer: Self-pay | Source: Ambulatory Visit | Attending: Obstetrics and Gynecology

## 2017-12-10 ENCOUNTER — Other Ambulatory Visit: Payer: Self-pay

## 2017-12-10 ENCOUNTER — Observation Stay (HOSPITAL_COMMUNITY)
Admission: AD | Admit: 2017-12-10 | Discharge: 2017-12-11 | Disposition: A | Discharge status: Home / Self Care | Payer: BLUE CROSS/BLUE SHIELD | Source: Ambulatory Visit | Attending: Obstetrics and Gynecology | Admitting: Obstetrics and Gynecology

## 2017-12-10 DIAGNOSIS — K567 Ileus, unspecified: Secondary | ICD-10-CM | POA: Diagnosis not present

## 2017-12-10 DIAGNOSIS — T8130XD Disruption of wound, unspecified, subsequent encounter: Secondary | ICD-10-CM | POA: Diagnosis not present

## 2017-12-10 DIAGNOSIS — L7632 Postprocedural hematoma of skin and subcutaneous tissue following other procedure: Principal | ICD-10-CM | POA: Insufficient documentation

## 2017-12-10 DIAGNOSIS — O902 Hematoma of obstetric wound: Secondary | ICD-10-CM

## 2017-12-10 DIAGNOSIS — I10 Essential (primary) hypertension: Secondary | ICD-10-CM | POA: Insufficient documentation

## 2017-12-10 DIAGNOSIS — Y838 Other surgical procedures as the cause of abnormal reaction of the patient, or of later complication, without mention of misadventure at the time of the procedure: Secondary | ICD-10-CM | POA: Diagnosis not present

## 2017-12-10 HISTORY — DX: Hematoma of obstetric wound: O90.2

## 2017-12-10 HISTORY — PX: WOUND EXPLORATION: SHX6188

## 2017-12-10 LAB — TYPE AND SCREEN
ABO/RH(D): O POS
Antibody Screen: NEGATIVE

## 2017-12-10 LAB — COMPREHENSIVE METABOLIC PANEL
ALBUMIN: 3.1 g/dL — AB (ref 3.5–5.0)
ALT: 34 U/L (ref 14–54)
AST: 21 U/L (ref 15–41)
Alkaline Phosphatase: 90 U/L (ref 38–126)
Anion gap: 11 (ref 5–15)
BUN: 10 mg/dL (ref 6–20)
CO2: 21 mmol/L — AB (ref 22–32)
CREATININE: 0.48 mg/dL (ref 0.44–1.00)
Calcium: 8.3 mg/dL — ABNORMAL LOW (ref 8.9–10.3)
Chloride: 102 mmol/L (ref 101–111)
GFR calc Af Amer: >60 mL/min (ref >60)
GLUCOSE: 86 mg/dL (ref 65–99)
POTASSIUM: 3.9 mmol/L (ref 3.5–5.1)
Sodium: 134 mmol/L — ABNORMAL LOW (ref 135–145)
Total Bilirubin: 0.7 mg/dL (ref 0.3–1.2)
Total Protein: 6.5 g/dL (ref 6.5–8.1)

## 2017-12-10 LAB — URINALYSIS, ROUTINE W REFLEX MICROSCOPIC
Bilirubin Urine: NEGATIVE
GLUCOSE, UA: NEGATIVE mg/dL
KETONES UR: 5 mg/dL — AB
Nitrite: NEGATIVE
PROTEIN: NEGATIVE mg/dL
Specific Gravity, Urine: 1.01 (ref 1.005–1.030)
pH: 7 (ref 5.0–8.0)

## 2017-12-10 LAB — FIBRINOGEN: FIBRINOGEN: 126 mg/dL — AB (ref 210–475)

## 2017-12-10 LAB — CBC WITH DIFFERENTIAL/PLATELET
BASOS ABS: 0 10*3/uL (ref 0.0–0.1)
BASOS PCT: 0 %
Basophils Absolute: 0 10*3/uL (ref 0.0–0.1)
Basophils Relative: 0 %
EOS ABS: 0 10*3/uL (ref 0.0–0.7)
EOS PCT: 1 %
Eosinophils Absolute: 0.1 10*3/uL (ref 0.0–0.7)
Eosinophils Relative: 0 %
HCT: 25.3 % — ABNORMAL LOW (ref 36.0–46.0)
HCT: 24.5 % — ABNORMAL LOW (ref 36.0–46.0)
HEMOGLOBIN: 8.3 g/dL — AB (ref 12.0–15.0)
Hemoglobin: 8.6 g/dL — ABNORMAL LOW (ref 12.0–15.0)
LYMPHS ABS: 1.3 10*3/uL (ref 0.7–4.0)
LYMPHS PCT: 26 %
Lymphocytes Relative: 12 %
Lymphs Abs: 3 10*3/uL (ref 0.7–4.0)
MCH: 29.5 pg (ref 26.0–34.0)
MCH: 29.5 pg (ref 26.0–34.0)
MCHC: 34 g/dL (ref 30.0–36.0)
MCHC: 33.9 g/dL (ref 30.0–36.0)
MCV: 86.6 fL (ref 78.0–100.0)
MCV: 87.2 fL (ref 78.0–100.0)
MONO ABS: 0.4 10*3/uL (ref 0.1–1.0)
MONOS PCT: 1 %
Monocytes Absolute: 0.2 10*3/uL (ref 0.1–1.0)
Monocytes Relative: 3 %
NEUTROS ABS: 8.1 10*3/uL — AB (ref 1.7–7.7)
NEUTROS PCT: 87 %
Neutro Abs: 9 10*3/uL — ABNORMAL HIGH (ref 1.7–7.7)
Neutrophils Relative %: 70 %
PLATELETS: 153 10*3/uL (ref 150–400)
Platelets: 170 10*3/uL (ref 150–400)
RBC: 2.92 MIL/uL — AB (ref 3.87–5.11)
RBC: 2.81 MIL/uL — ABNORMAL LOW (ref 3.87–5.11)
RDW: 15.4 % (ref 11.5–15.5)
RDW: 15.3 % (ref 11.5–15.5)
WBC: 11.5 10*3/uL — AB (ref 4.0–10.5)
WBC: 10.4 10*3/uL (ref 4.0–10.5)

## 2017-12-10 LAB — APTT
APTT: 37 s — AB (ref 24–36)
aPTT: 33 seconds (ref 24–36)

## 2017-12-10 LAB — PROTIME-INR
INR: 1.31
Prothrombin Time: 16.2 seconds — ABNORMAL HIGH (ref 11.4–15.2)

## 2017-12-10 LAB — ABO/RH: ABO/RH(D): O POS

## 2017-12-10 SURGERY — WOUND EXPLORATION
Anesthesia: General | Site: Abdomen

## 2017-12-10 MED ORDER — HYDROCODONE-ACETAMINOPHEN 7.5-325 MG PO TABS: 1.0000 | ORAL_TABLET | Freq: Once | ORAL | Status: DC | PRN

## 2017-12-10 MED ORDER — LIDOCAINE HCL (CARDIAC) 20 MG/ML IV SOLN
INTRAVENOUS | Status: AC
  Filled 2017-12-10: qty 5

## 2017-12-10 MED ORDER — HYDROMORPHONE HCL 1 MG/ML IJ SOLN: 0.2000 mg | INTRAMUSCULAR | Status: DC | PRN

## 2017-12-10 MED ORDER — SODIUM CHLORIDE 0.9 % IV SOLN
INTRAVENOUS | Status: DC | PRN
  Administered 2017-12-10: 500 mL

## 2017-12-10 MED ORDER — ONDANSETRON HCL 4 MG PO TABS: 4.0000 mg | ORAL_TABLET | Freq: Four times a day (QID) | ORAL | Status: DC | PRN

## 2017-12-10 MED ORDER — OXYCODONE HCL 5 MG PO TABS: 10.0000 mg | ORAL_TABLET | Freq: Four times a day (QID) | ORAL | Status: DC | PRN

## 2017-12-10 MED ORDER — MIDAZOLAM HCL 2 MG/2ML IJ SOLN
INTRAMUSCULAR | Status: DC | PRN
  Administered 2017-12-10: 2 mg via INTRAVENOUS

## 2017-12-10 MED ORDER — ACETAMINOPHEN 10 MG/ML IV SOLN
INTRAVENOUS | Status: AC
  Filled 2017-12-10: qty 100

## 2017-12-10 MED ORDER — ONDANSETRON HCL 4 MG/2ML IJ SOLN
INTRAMUSCULAR | Status: DC | PRN
  Administered 2017-12-10: 4 mg via INTRAVENOUS

## 2017-12-10 MED ORDER — ROCURONIUM BROMIDE 100 MG/10ML IV SOLN
INTRAVENOUS | Status: AC
  Filled 2017-12-10: qty 1

## 2017-12-10 MED ORDER — HYDROCHLOROTHIAZIDE 12.5 MG PO CAPS
12.5000 mg | ORAL_CAPSULE | Freq: Every day | ORAL | Status: DC
  Administered 2017-12-10 – 2017-12-11 (×2): 12.5 mg via ORAL
  Filled 2017-12-10 (×2): qty 1

## 2017-12-10 MED ORDER — FENTANYL CITRATE (PF) 250 MCG/5ML IJ SOLN
INTRAMUSCULAR | Status: AC
  Filled 2017-12-10: qty 5

## 2017-12-10 MED ORDER — LACTATED RINGERS IV SOLN: INTRAVENOUS | Status: DC

## 2017-12-10 MED ORDER — SOD CITRATE-CITRIC ACID 500-334 MG/5ML PO SOLN
ORAL | Status: AC
  Administered 2017-12-10: 30 mL via ORAL
  Filled 2017-12-10: qty 15

## 2017-12-10 MED ORDER — DEXAMETHASONE SODIUM PHOSPHATE 4 MG/ML IJ SOLN
INTRAMUSCULAR | Status: DC | PRN
  Administered 2017-12-10: 4 mg via INTRAVENOUS

## 2017-12-10 MED ORDER — PROPOFOL 10 MG/ML IV BOLUS
INTRAVENOUS | Status: DC | PRN
  Administered 2017-12-10: 180 mg via INTRAVENOUS

## 2017-12-10 MED ORDER — SIMETHICONE 80 MG PO CHEW: 80.0000 mg | CHEWABLE_TABLET | Freq: Four times a day (QID) | ORAL | Status: DC | PRN

## 2017-12-10 MED ORDER — SOD CITRATE-CITRIC ACID 500-334 MG/5ML PO SOLN
30.0000 mL | Freq: Once | ORAL | Status: AC
  Administered 2017-12-10: 30 mL via ORAL

## 2017-12-10 MED ORDER — ONDANSETRON HCL 4 MG/2ML IJ SOLN: 4.0000 mg | Freq: Four times a day (QID) | INTRAMUSCULAR | Status: DC | PRN

## 2017-12-10 MED ORDER — ACETAMINOPHEN 10 MG/ML IV SOLN
INTRAVENOUS | Status: DC | PRN
  Administered 2017-12-10: 1000 mg via INTRAVENOUS

## 2017-12-10 MED ORDER — FENTANYL CITRATE (PF) 100 MCG/2ML IJ SOLN: 25.0000 ug | INTRAMUSCULAR | Status: DC | PRN

## 2017-12-10 MED ORDER — DOCUSATE SODIUM 100 MG PO CAPS
100.0000 mg | ORAL_CAPSULE | Freq: Two times a day (BID) | ORAL | Status: DC
  Administered 2017-12-10 – 2017-12-11 (×3): 100 mg via ORAL
  Filled 2017-12-10 (×3): qty 1

## 2017-12-10 MED ORDER — ACETAMINOPHEN 325 MG PO TABS: 650.0000 mg | ORAL_TABLET | ORAL | Status: DC | PRN

## 2017-12-10 MED ORDER — MIDAZOLAM HCL 2 MG/2ML IJ SOLN
INTRAMUSCULAR | Status: AC
  Filled 2017-12-10: qty 2

## 2017-12-10 MED ORDER — SODIUM CHLORIDE 0.9 % IV SOLN
Freq: Once | INTRAVENOUS | Status: DC
  Filled 2017-12-10: qty 500000

## 2017-12-10 MED ORDER — PROPOFOL 10 MG/ML IV BOLUS
INTRAVENOUS | Status: AC
  Filled 2017-12-10: qty 20

## 2017-12-10 MED ORDER — SUCCINYLCHOLINE CHLORIDE 20 MG/ML IJ SOLN
INTRAMUSCULAR | Status: DC | PRN
  Administered 2017-12-10: 100 mg via INTRAVENOUS

## 2017-12-10 MED ORDER — DEXAMETHASONE SODIUM PHOSPHATE 4 MG/ML IJ SOLN
INTRAMUSCULAR | Status: AC
  Filled 2017-12-10: qty 1

## 2017-12-10 MED ORDER — ROCURONIUM BROMIDE 100 MG/10ML IV SOLN
INTRAVENOUS | Status: DC | PRN
  Administered 2017-12-10: 20 mg via INTRAVENOUS
  Administered 2017-12-10: 10 mg via INTRAVENOUS

## 2017-12-10 MED ORDER — SUGAMMADEX SODIUM 200 MG/2ML IV SOLN
INTRAVENOUS | Status: DC | PRN
  Administered 2017-12-10: 200 mg via INTRAVENOUS

## 2017-12-10 MED ORDER — SCOPOLAMINE 1 MG/3DAYS TD PT72
MEDICATED_PATCH | TRANSDERMAL | Status: AC
  Filled 2017-12-10: qty 1

## 2017-12-10 MED ORDER — CEFAZOLIN SODIUM-DEXTROSE 2-3 GM-%(50ML) IV SOLR
INTRAVENOUS | Status: DC | PRN
  Administered 2017-12-10: 2 g via INTRAVENOUS

## 2017-12-10 MED ORDER — METOCLOPRAMIDE HCL 5 MG/ML IJ SOLN: 10.0000 mg | Freq: Once | INTRAMUSCULAR | Status: DC | PRN

## 2017-12-10 MED ORDER — MEPERIDINE HCL 25 MG/ML IJ SOLN: 6.2500 mg | INTRAMUSCULAR | Status: DC | PRN

## 2017-12-10 MED ORDER — LIDOCAINE HCL (CARDIAC) 20 MG/ML IV SOLN
INTRAVENOUS | Status: DC | PRN
  Administered 2017-12-10: 80 mg via INTRAVENOUS

## 2017-12-10 MED ORDER — SCOPOLAMINE 1 MG/3DAYS TD PT72: MEDICATED_PATCH | TRANSDERMAL | Status: DC | PRN

## 2017-12-10 MED ORDER — FENTANYL CITRATE (PF) 100 MCG/2ML IJ SOLN
INTRAMUSCULAR | Status: DC | PRN
  Administered 2017-12-10 (×3): 50 ug via INTRAVENOUS

## 2017-12-10 MED ORDER — LACTATED RINGERS IV SOLN
INTRAVENOUS | Status: DC
  Administered 2017-12-10 (×3): via INTRAVENOUS
  Administered 2017-12-10: 125 mL/h via INTRAVENOUS
  Administered 2017-12-11: 04:00:00 via INTRAVENOUS

## 2017-12-10 MED ORDER — SCOPOLAMINE 1 MG/3DAYS TD PT72
1.0000 | MEDICATED_PATCH | TRANSDERMAL | Status: DC
  Administered 2017-12-10: 1.5 mg via TRANSDERMAL

## 2017-12-10 MED ORDER — ONDANSETRON HCL 4 MG/2ML IJ SOLN
INTRAMUSCULAR | Status: AC
  Filled 2017-12-10: qty 2

## 2017-12-10 MED ORDER — MORPHINE SULFATE (PF) 10 MG/ML IV SOLN
10.0000 mg | Freq: Once | INTRAVENOUS | Status: AC
  Administered 2017-12-10: 10 mg via INTRAMUSCULAR
  Filled 2017-12-10: qty 1

## 2017-12-10 MED ORDER — OXYCODONE HCL 5 MG PO TABS: 5.0000 mg | ORAL_TABLET | ORAL | Status: DC | PRN

## 2017-12-10 MED ORDER — SUCCINYLCHOLINE CHLORIDE 200 MG/10ML IV SOSY
PREFILLED_SYRINGE | INTRAVENOUS | Status: AC
  Filled 2017-12-10: qty 10

## 2017-12-10 MED ORDER — CEFAZOLIN SODIUM-DEXTROSE 2-4 GM/100ML-% IV SOLN
2.0000 g | INTRAVENOUS | Status: DC
  Filled 2017-12-10: qty 100

## 2017-12-10 MED ORDER — SUGAMMADEX SODIUM 200 MG/2ML IV SOLN
INTRAVENOUS | Status: AC
  Filled 2017-12-10: qty 2

## 2017-12-10 SURGICAL SUPPLY — 32 items
BINDER ABD UNIV 10 28-50 (GAUZE/BANDAGES/DRESSINGS) ×1 IMPLANT
BINDER ABDOM UNIV 10 (GAUZE/BANDAGES/DRESSINGS) ×2
CANISTER SUCT 3000ML PPV (MISCELLANEOUS) ×2 IMPLANT
CLOTH BEACON ORANGE TIMEOUT ST (SAFETY) IMPLANT
DECANTER SPIKE VIAL GLASS SM (MISCELLANEOUS) ×2 IMPLANT
DRSG OPSITE POSTOP 4X10 (GAUZE/BANDAGES/DRESSINGS) ×2 IMPLANT
DURAPREP 26ML APPLICATOR (WOUND CARE) IMPLANT
GAUZE SPONGE 4X4 16PLY XRAY LF (GAUZE/BANDAGES/DRESSINGS) IMPLANT
GLOVE BIOGEL PI IND STRL 6.5 (GLOVE) ×1 IMPLANT
GLOVE BIOGEL PI IND STRL 7.0 (GLOVE) ×1 IMPLANT
GLOVE BIOGEL PI INDICATOR 6.5 (GLOVE) ×1
GLOVE BIOGEL PI INDICATOR 7.0 (GLOVE) ×1
GLOVE ECLIPSE 7.0 STRL STRAW (GLOVE) ×2 IMPLANT
GOWN STRL REUS W/TWL LRG LVL3 (GOWN DISPOSABLE) ×8 IMPLANT
HEMOSTAT ARISTA ABSORB 3G PWDR (MISCELLANEOUS) ×2 IMPLANT
NEEDLE HYPO 22GX1.5 SAFETY (NEEDLE) IMPLANT
NS IRRIG 1000ML POUR BTL (IV SOLUTION) ×2 IMPLANT
PACK ABDOMINAL GYN (CUSTOM PROCEDURE TRAY) ×2 IMPLANT
PAD OB MATERNITY 4.3X12.25 (PERSONAL CARE ITEMS) ×2 IMPLANT
PROTECTOR NERVE ULNAR (MISCELLANEOUS) ×2 IMPLANT
SPONGE GAUZE 4X4 12PLY STER LF (GAUZE/BANDAGES/DRESSINGS) IMPLANT
SPONGE LAP 18X18 X RAY DECT (DISPOSABLE) IMPLANT
STAPLER VISISTAT 35W (STAPLE) ×2 IMPLANT
SUT MON AB 4-0 PS1 27 (SUTURE) ×2 IMPLANT
SUT PLAIN 2 0 XLH (SUTURE) ×4 IMPLANT
SUT VIC AB 0 CT1 27 (SUTURE) ×1
SUT VIC AB 0 CT1 27XBRD ANBCTR (SUTURE) ×1 IMPLANT
SYR BULB IRRIGATION 50ML (SYRINGE) ×2 IMPLANT
SYR CONTROL 10ML LL (SYRINGE) IMPLANT
TAPE CLOTH SURG 4X10 WHT LF (GAUZE/BANDAGES/DRESSINGS) ×2 IMPLANT
TOWEL OR 17X24 6PK STRL BLUE (TOWEL DISPOSABLE) ×4 IMPLANT
TRAY FOLEY CATH SILVER 14FR (SET/KITS/TRAYS/PACK) IMPLANT

## 2017-12-10 NOTE — Anesthesia Preprocedure Evaluation (Signed)
Anesthesia Evaluation  Patient identified by MRN, date of birth, ID band Patient awake    Reviewed: Allergy & Precautions, NPO status , Patient's Chart, lab work & pertinent test results  Airway Mallampati: II  TM Distance: >3 FB Neck ROM: Full    Dental no notable dental hx. (+) Teeth Intact   Pulmonary neg pulmonary ROS,    Pulmonary exam normal breath sounds clear to auscultation       Cardiovascular hypertension, Pt. on medications Normal cardiovascular exam Rhythm:Regular Rate:Normal     Neuro/Psych negative neurological ROS  negative psych ROS   GI/Hepatic negative GI ROS, Neg liver ROS,   Endo/Other  Obesity  Renal/GU negative Renal ROS  negative genitourinary   Musculoskeletal negative musculoskeletal ROS (+) Hematoma incision post C/Section   Abdominal (+) + obese,   Peds  Hematology  (+) anemia ,   Anesthesia Other Findings   Reproductive/Obstetrics                             Anesthesia Physical Anesthesia Plan  ASA: II and emergent  Anesthesia Plan: General   Post-op Pain Management:    Induction: Intravenous, Rapid sequence and Cricoid pressure planned  PONV Risk Score and Plan: 4 or greater and Ondansetron, Scopolamine patch - Pre-op and Treatment may vary due to age or medical condition  Airway Management Planned: Oral ETT  Additional Equipment:   Intra-op Plan:   Post-operative Plan: Extubation in OR  Informed Consent: I have reviewed the patients History and Physical, chart, labs and discussed the procedure including the risks, benefits and alternatives for the proposed anesthesia with the patient or authorized representative who has indicated his/her understanding and acceptance.   Dental advisory given  Plan Discussed with: CRNA, Anesthesiologist and Surgeon  Anesthesia Plan Comments:         Anesthesia Quick Evaluation

## 2017-12-10 NOTE — Brief Op Note (Signed)
12/10/2017  6:56 AM  PATIENT:  Alice Nash  32 y.o. female  PRE-OPERATIVE DIAGNOSIS:  hematoma wound, s/p cesarean section  POST-OPERATIVE DIAGNOSIS:  hematoma wound  PROCEDURE:  Procedure(s): WOUND EXPLORATION (N/A), evacuation of hematoma  SURGEON:  Surgeon(s) and Role:    Thurnell Lose, MD - Primary  PHYSICIAN ASSISTANT:   ASSISTANTS: Mannie Stabile, CNM   ANESTHESIA:   general  EBL:  150 mL   BLOOD ADMINISTERED:none  DRAINS: NOne  LOCAL MEDICATIONS USED:  NONE  SPECIMEN:  No Specimen  DISPOSITION OF SPECIMEN:  N/A  COUNTS:  YES  TOURNIQUET:  * No tourniquets in log *  DICTATION: .Other Dictation: Dictation Number [id number]  PLAN OF CARE: Admit for observation  PATIENT DISPOSITION:  PACU - hemodynamically stable.   Delay start of Pharmacological VTE agent (>24hrs) due to surgical blood loss or risk of bleeding: yes

## 2017-12-10 NOTE — Lactation Note (Signed)
Lactation Consultation Note  Patient Name: Erlean Mealor Today's Date: 12/10/2017   Mom and baby were scheduled for o/p Lactation services for today and mom was readmitted for incision hematoma needing surgical repair and monitoring.   Mom was separated from mom during hospital stay due to influenza at delivery.  Mom pumped and had caregivers bottle feed while in hospital.  Mom has now been pumping up to 10 oz at each pumping session.   Mom reports she is using 30 flanges and has 36 as needed, but reports doing well with 30 flanges.  Baby has not had formula in several days.   Mom reports few attempts with latching at home, but baby is having difficulty with latching to large diameter nipples and maintaining feeding.    LC attempted to latch baby in football hold on left breast, baby appears to be tongue thrusting and not maintaining latch.   Frederick attempted laid back hold with improved latching, but baby fussy with positioning.  DR. Cletis Media arrived at bedside to work on moms incision due to continued bleeding.    Mom encouraged LC to assist with bottle feeding as baby is fussy at this time.  Baby took about 30mls from medela bottle.  LC instructed mom on paced feedings and keeping baby's mouth wide with lips flanged to aid in easier transition to latching when mom and baby are ready.  Baby is not burping well at this time and a little fussy, but acts content after feeding.  Mom encouraged to continue to work on burping baby well with feedings.    Mom to continue pumping and bottle feeding and then call for o/p lactation appointment early next week.    Mom to be discharged tomorrow and will call for assist as needed.       Maternal Data    Feeding    LATCH Score                   Interventions    Lactation Tools Discussed/Used     Consult Status      Malena Edman 12/10/2017, 3:51 PM

## 2017-12-10 NOTE — Progress Notes (Signed)
Day of Surgery Procedure(s) (LRB): WOUND EXPLORATION (N/A)  Subjective: Patient reports that pain is well managed.   No nausea / vomiting.  .   Objective: BP 137/75 (BP Location: Left Arm)   Pulse 89   Temp 99 F (37.2 C)   Resp 16   Ht 5\' 4"  (1.626 m)   Wt 89.8 kg (198 lb)   SpO2 98%   BMI 33.99 kg/m   Abdomen:soft and appropriately tender Extremities: Homans sign is negative, no sign of DVT, 2+ edema Incision: covered  Assessment: s/p Procedure(s): WOUND EXPLORATION: stable  Plan: Advance diet Encourage ambulation Advance to PO medication Discharge home likely this afternoon  Op findings reviewed with patient Questions answered  LOS: 0 days    Katharine Look A Sarit Sparano 12/10/2017, 8:34 AM

## 2017-12-10 NOTE — Anesthesia Postprocedure Evaluation (Signed)
Anesthesia Post Note  Patient: Alice Nash  Procedure(s) Performed: WOUND EXPLORATION (N/A Abdomen)     Anesthesia Post Evaluation  Last Vitals:  Vitals:   12/10/17 0900 12/10/17 1041  BP: 134/72 (!) 160/79  Pulse: 79 80  Resp: 16 16  Temp: 36.8 C   SpO2:      Last Pain:  Vitals:   12/10/17 0900  TempSrc: Oral  PainSc: 0-No pain   Pain Goal:                 [name] A.

## 2017-12-10 NOTE — Addendum Note (Signed)
Addendum  created 12/10/17 1417 by Asher Muir, CRNA   Sign clinical note

## 2017-12-10 NOTE — Plan of Care (Signed)
Patient is alert and oriented x4. Patient has been out of bed to the bathroom with assistance. Voided x2. Patient denies any pain or discomfort at this time.

## 2017-12-10 NOTE — H&P (Signed)
Chief Complaint: Postpartum Complications and Post-op Problem   None    SUBJECTIVE HPI: Alice Nash is a 32 y.o. G1P1000 at Unknown who presents to Maternity Admissions reporting sudden incisional pain.  States has only been taking Motrin for pain.  Took Ibuprofen 600mg  at 2200 and repeat another dose at 2330.  Than took  Tylenol 500mg  PO.  Pt notice blood coming from incision site.  Pt denies fever.  Normal bowel and bladder function.  Pt delivery complicated by positive influenza swab with primary LTCS for fetal intolerance to labor.  Pt was positive GBS.  Pt ltcs recovery was complicated by an ileus.  Location: incision Quality: searing pain Severity: 5/10 on pain scale Duration: 1 night Context: with movement Modifying factors: Took ibuprofen 600mg  x 2       Past Medical History:  Diagnosis Date  . Fibroid    OB History  Gravida Para Term Preterm AB Living  1 1 1         SAB TAB Ectopic Multiple Live Births          0    # Outcome Date GA Lbr Len/2nd Weight Sex Delivery Anes PTL Lv  1 Term     M CS-Unspec             Past Surgical History:  Procedure Laterality Date  . CESAREAN SECTION N/A 12/02/2017   Procedure: CESAREAN SECTION;  Surgeon: Delsa Bern, MD;  Location: [place];  Service: Obstetrics;  Laterality: N/A;  . WISDOM TOOTH EXTRACTION     Social History        Socioeconomic History  . Marital status: Married    Spouse name: Not on file  . Number of children: Not on file  . Years of education: Not on file  . Highest education level: Not on file  Social Needs  . Financial resource strain: Not on file  . Food insecurity - worry: Not on file  . Food insecurity - inability: Not on file  . Transportation needs - medical: Not on file  . Transportation needs - non-medical: Not on file  Occupational History  . Not on file  Tobacco Use  . Smoking status: Never Smoker  . Smokeless tobacco: Never Used  Substance and  Sexual Activity  . Alcohol use: No    Frequency: Never  . Drug use: No  . Sexual activity: Not Currently  Other Topics Concern  . Not on file  Social History Narrative  . Not on file        Family History  Problem Relation Age of Onset  . Hypertension Mother   . Kidney cancer Father    No current facility-administered medications on file prior to encounter.          Current Outpatient Medications on File Prior to Encounter  Medication Sig Dispense Refill  . hydrocortisone-pramoxine (PROCTOFOAM HC) rectal foam Place 1 applicator rectally 2 (two) times daily. 10 g 2  . ibuprofen (ADVIL,MOTRIN) 600 MG tablet Take 1 tablet (600 mg total) by mouth every 6 (six) hours as needed. 30 tablet 2  . NIFEdipine (PROCARDIA-XL/ADALAT CC) 30 MG 24 hr tablet Take 1 tablet (30 mg total) by mouth daily. 30 tablet 2  . oxyCODONE (ROXICODONE) 5 MG immediate release tablet Take 1 tablet (5 mg total) by mouth every 4 (four) hours as needed. 10 tablet 0  . Prenatal Vit-Fe Fumarate-FA (PRENATAL MULTIVITAMIN) TABS tablet Take 1 tablet by mouth daily at 12 noon.  No Known Allergies  I have reviewed patient's Past Medical Hx, Surgical Hx, Family Hx, Social Hx, medications and allergies.   Review of Systems  Constitutional: Negative.   HENT: Negative.   Eyes: Negative.   Respiratory: Negative.   Cardiovascular: Negative.   Gastrointestinal: Positive for abdominal pain.       Bleeding in incisional site  Endocrine: Negative.   Musculoskeletal: Negative.   Skin: Negative.   Allergic/Immunologic: Negative.   Neurological: Negative.   Hematological: Negative.   Psychiatric/Behavioral: Negative.     OBJECTIVE Patient Vitals for the past 24 hrs:  BP Temp Pulse Resp SpO2 Height Weight  12/10/17 0212 (!) 145/84 98.3 F (36.8 C) 83 19 99 % - -  12/10/17 0207 - - - - - 5\' 4"  (1.626 m) 89.8 kg (198 lb)   Constitutional: Well-developed, well-nourished female in no acute distress.   Cardiovascular: normal rate Respiratory: normal rate and effort.  GI: Abd soft,tender.  Reddened  Area up to umbilicus.  Warmth to palpation.   Pos BS x 4 Ecchymotic area around incision solid area 10 in by 4 inches.  Small amount of blood noted coming from incision. MS: Extremities nontender, no edema, normal ROM Neurologic: Alert and oriented x 4.   LAB RESULTS LabResultsLast24Hours       Results for orders placed or performed during the hospital encounter of 12/10/17 (from the past 24 hour(s))  CBC with Differential     Status: Abnormal   Collection Time: 12/10/17  2:35 AM  Result Value Ref Range   WBC 11.5 (H) 4.0 - 10.5 K/uL   RBC 2.92 (L) 3.87 - 5.11 MIL/uL   Hemoglobin 8.6 (L) 12.0 - 15.0 g/dL   HCT 25.3 (L) 36.0 - 46.0 %   MCV 86.6 78.0 - 100.0 fL   MCH 29.5 26.0 - 34.0 pg   MCHC 34.0 30.0 - 36.0 g/dL   RDW 15.4 11.5 - 15.5 %   Platelets 153 150 - 400 K/uL   Neutrophils Relative % 70 %   Neutro Abs 8.1 (H) 1.7 - 7.7 K/uL   Lymphocytes Relative 26 %   Lymphs Abs 3.0 0.7 - 4.0 K/uL   Monocytes Relative 3 %   Monocytes Absolute 0.4 0.1 - 1.0 K/uL   Eosinophils Relative 1 %   Eosinophils Absolute 0.1 0.0 - 0.7 K/uL   Basophils Relative 0 %   Basophils Absolute 0.0 0.0 - 0.1 K/uL  Comprehensive metabolic panel     Status: Abnormal   Collection Time: 12/10/17  2:35 AM  Result Value Ref Range   Sodium 134 (L) 135 - 145 mmol/L   Potassium 3.9 3.5 - 5.1 mmol/L   Chloride 102 101 - 111 mmol/L   CO2 21 (L) 22 - 32 mmol/L   Glucose, Bld 86 65 - 99 mg/dL   BUN 10 6 - 20 mg/dL   Creatinine, Ser 0.48 0.44 - 1.00 mg/dL   Calcium 8.3 (L) 8.9 - 10.3 mg/dL   Total Protein 6.5 6.5 - 8.1 g/dL   Albumin 3.1 (L) 3.5 - 5.0 g/dL   AST 21 15 - 41 U/L   ALT 34 14 - 54 U/L   Alkaline Phosphatase 90 38 - 126 U/L   Total Bilirubin 0.7 0.3 - 1.2 mg/dL   GFR calc non Af Amer >60 >60 mL/min   GFR calc Af Amer >60 >60 mL/min   Anion gap 11 5 - 15       IMAGING  ImagingResults  Dg Abd  1 View  Result Date: 12/04/2017 CLINICAL DATA:  Nausea and vomiting. Postoperative cesarean section from 12/02/2017. EXAM: ABDOMEN - 1 VIEW COMPARISON:  None. FINDINGS: Gas-filled dilated small and large bowel, likely representing ileus. Stool and gas in the rectum. No radiopaque stones. Visualized bones appear intact. IMPRESSION: Gas-filled dilated large and small bowel likely representing ileus. Electronically Signed   By: Lucienne Capers M.D.   On: 12/04/2017 06:43     MAU COURSE    Orders Placed This Encounter  Procedures  . CBC with Differential  . Comprehensive metabolic panel  . Urinalysis, Routine w reflex microscopic      Meds ordered this encounter  Medications  . Morphine Sulfate (PF) SOLN 10 mg    MDM PE labs and assessment.  Morphine 10 mg IM for pain  Dr. Barkley Boards in to see patient.  Incision cleansed with betadine, incision injected with 1% lidocaine.  Scalp used to open incision.  100cc of blood return.  Decision made to take pt to OR to evacuate hematoma and check for active bleeding.    ASSESSMENT Postpartum hematoma after LTCS.  PLAN Pt to OR for hematoma evacuation.

## 2017-12-10 NOTE — MAU Note (Cosign Needed)
Chief Complaint: Postpartum Complications and Post-op Problem   None    SUBJECTIVE HPI: Alice Nash is a 32 y.o. G1P1000 at Unknown who presents to Maternity Admissions reporting sudden incisional pain.  States has only been taking Motrin for pain.  Took Ibuprofen 600mg  at 2200 and repeat another dose at 2330.  Than took  Tylenol 500mg  PO.  Pt notice blood coming from incision site.  Pt denies fever.  Normal bowel and bladder function.  Pt delivery complicated by positive influenza swab with primary LTCS for fetal intolerance to labor.  Pt was positive GBS.  Pt ltcs recovery was complicated by an ileus.  Location: incision Quality: searing pain Severity: 5/10 on pain scale Duration: 1 night Context: with movement Modifying factors: Took ibuprofen 600mg  x 2   Past Medical History:  Diagnosis Date  . Fibroid    OB History  Gravida Para Term Preterm AB Living  1 1 1         SAB TAB Ectopic Multiple Live Births          0    # Outcome Date GA Lbr Len/2nd Weight Sex Delivery Anes PTL Lv  1 Term     M CS-Unspec        Past Surgical History:  Procedure Laterality Date  . CESAREAN SECTION N/A 12/02/2017   Procedure: CESAREAN SECTION;  Surgeon: Delsa Bern, MD;  Location: [place];  Service: Obstetrics;  Laterality: N/A;  . WISDOM TOOTH EXTRACTION     Social History   Socioeconomic History  . Marital status: Married    Spouse name: Not on file  . Number of children: Not on file  . Years of education: Not on file  . Highest education level: Not on file  Social Needs  . Financial resource strain: Not on file  . Food insecurity - worry: Not on file  . Food insecurity - inability: Not on file  . Transportation needs - medical: Not on file  . Transportation needs - non-medical: Not on file  Occupational History  . Not on file  Tobacco Use  . Smoking status: Never Smoker  . Smokeless tobacco: Never Used  Substance and Sexual Activity  . Alcohol use: No     Frequency: Never  . Drug use: No  . Sexual activity: Not Currently  Other Topics Concern  . Not on file  Social History Narrative  . Not on file   Family History  Problem Relation Age of Onset  . Hypertension Mother   . Kidney cancer Father    No current facility-administered medications on file prior to encounter.    Current Outpatient Medications on File Prior to Encounter  Medication Sig Dispense Refill  . hydrocortisone-pramoxine (PROCTOFOAM HC) rectal foam Place 1 applicator rectally 2 (two) times daily. 10 g 2  . ibuprofen (ADVIL,MOTRIN) 600 MG tablet Take 1 tablet (600 mg total) by mouth every 6 (six) hours as needed. 30 tablet 2  . NIFEdipine (PROCARDIA-XL/ADALAT CC) 30 MG 24 hr tablet Take 1 tablet (30 mg total) by mouth daily. 30 tablet 2  . oxyCODONE (ROXICODONE) 5 MG immediate release tablet Take 1 tablet (5 mg total) by mouth every 4 (four) hours as needed. 10 tablet 0  . Prenatal Vit-Fe Fumarate-FA (PRENATAL MULTIVITAMIN) TABS tablet Take 1 tablet by mouth daily at 12 noon.     No Known Allergies  I have reviewed patient's Past Medical Hx, Surgical Hx, Family Hx, Social Hx, medications and allergies.   Review of Systems  Constitutional: Negative.   HENT: Negative.   Eyes: Negative.   Respiratory: Negative.   Cardiovascular: Negative.   Gastrointestinal: Positive for abdominal pain.       Bleeding in incisional site  Endocrine: Negative.   Musculoskeletal: Negative.   Skin: Negative.   Allergic/Immunologic: Negative.   Neurological: Negative.   Hematological: Negative.   Psychiatric/Behavioral: Negative.     OBJECTIVE Patient Vitals for the past 24 hrs:  BP Temp Pulse Resp SpO2 Height Weight  12/10/17 0212 (!) 145/84 98.3 F (36.8 C) 83 19 99 % - -  12/10/17 0207 - - - - - 5\' 4"  (1.626 m) 89.8 kg (198 lb)   Constitutional: Well-developed, well-nourished female in no acute distress.  Cardiovascular: normal rate Respiratory: normal rate and effort.   GI: Abd soft,tender.  Reddened  Area up to umbilicus.  Warmth to palpation.   Pos BS x 4 Ecchymotic area around incision solid area 10 in by 4 inches.  Small amount of blood noted coming from incision. MS: Extremities nontender, no edema, normal ROM Neurologic: Alert and oriented x 4.   LAB RESULTS Results for orders placed or performed during the hospital encounter of 12/10/17 (from the past 24 hour(s))  CBC with Differential     Status: Abnormal   Collection Time: 12/10/17  2:35 AM  Result Value Ref Range   WBC 11.5 (H) 4.0 - 10.5 K/uL   RBC 2.92 (L) 3.87 - 5.11 MIL/uL   Hemoglobin 8.6 (L) 12.0 - 15.0 g/dL   HCT 25.3 (L) 36.0 - 46.0 %   MCV 86.6 78.0 - 100.0 fL   MCH 29.5 26.0 - 34.0 pg   MCHC 34.0 30.0 - 36.0 g/dL   RDW 15.4 11.5 - 15.5 %   Platelets 153 150 - 400 K/uL   Neutrophils Relative % 70 %   Neutro Abs 8.1 (H) 1.7 - 7.7 K/uL   Lymphocytes Relative 26 %   Lymphs Abs 3.0 0.7 - 4.0 K/uL   Monocytes Relative 3 %   Monocytes Absolute 0.4 0.1 - 1.0 K/uL   Eosinophils Relative 1 %   Eosinophils Absolute 0.1 0.0 - 0.7 K/uL   Basophils Relative 0 %   Basophils Absolute 0.0 0.0 - 0.1 K/uL  Comprehensive metabolic panel     Status: Abnormal   Collection Time: 12/10/17  2:35 AM  Result Value Ref Range   Sodium 134 (L) 135 - 145 mmol/L   Potassium 3.9 3.5 - 5.1 mmol/L   Chloride 102 101 - 111 mmol/L   CO2 21 (L) 22 - 32 mmol/L   Glucose, Bld 86 65 - 99 mg/dL   BUN 10 6 - 20 mg/dL   Creatinine, Ser 0.48 0.44 - 1.00 mg/dL   Calcium 8.3 (L) 8.9 - 10.3 mg/dL   Total Protein 6.5 6.5 - 8.1 g/dL   Albumin 3.1 (L) 3.5 - 5.0 g/dL   AST 21 15 - 41 U/L   ALT 34 14 - 54 U/L   Alkaline Phosphatase 90 38 - 126 U/L   Total Bilirubin 0.7 0.3 - 1.2 mg/dL   GFR calc non Af Amer >60 >60 mL/min   GFR calc Af Amer >60 >60 mL/min   Anion gap 11 5 - 15    IMAGING Dg Abd 1 View  Result Date: 12/04/2017 CLINICAL DATA:  Nausea and vomiting. Postoperative cesarean section from 12/02/2017.  EXAM: ABDOMEN - 1 VIEW COMPARISON:  None. FINDINGS: Gas-filled dilated small and large bowel, likely representing ileus. Stool and gas  in the rectum. No radiopaque stones. Visualized bones appear intact. IMPRESSION: Gas-filled dilated large and small bowel likely representing ileus. Electronically Signed   By: Lucienne Capers M.D.   On: 12/04/2017 06:43    MAU COURSE Orders Placed This Encounter  Procedures  . CBC with Differential  . Comprehensive metabolic panel  . Urinalysis, Routine w reflex microscopic   Meds ordered this encounter  Medications  . Morphine Sulfate (PF) SOLN 10 mg    MDM PE labs and assessment.  Morphine 10 mg IM for pain  Dr. Barkley Boards in to see patient.  Incision cleansed with betadine, incision injected with 1% lidocaine.  Scalp used to open incision.  100cc of blood return.  Decision made to take pt to OR to evacuate hematoma and check for active bleeding.   ASSESSMENT Postpartum hematoma after LTCS.  PLAN Pt to OR.      Starla Link, CNM 12/10/2017  3:17 AM

## 2017-12-10 NOTE — Transfer of Care (Signed)
Immediate Anesthesia Transfer of Care Note  Patient: Alice Nash  Procedure(s) Performed: WOUND EXPLORATION (N/A Abdomen)  Patient Location: PACU  Anesthesia Type:General  Level of Consciousness: awake and alert   Airway & Oxygen Therapy: Patient Spontanous Breathing and Patient connected to nasal cannula oxygen  Post-op Assessment: Report given to RN and Post -op Vital signs reviewed and stable  Post vital signs: Reviewed and stable HR 99, RR 20, SaO2 100%, BP 120/83  Last Vitals:  Vitals:   12/10/17 0212 12/10/17 0423  BP: (!) 145/84 140/80  Pulse: 83 97  Resp: 19   Temp: 36.8 C   SpO2: 99%     Last Pain:  Vitals:   12/10/17 0213  PainSc: 4          Complications: No apparent anesthesia complications

## 2017-12-10 NOTE — Anesthesia Procedure Notes (Signed)
Procedure Name: Intubation Date/Time: 12/10/2017 5:31 AM Performed by: Elenore Paddy, CRNA Pre-anesthesia Checklist: Patient identified, Emergency Drugs available, Suction available, Patient being monitored and Timeout performed Patient Re-evaluated:Patient Re-evaluated prior to induction Oxygen Delivery Method: Circle system utilized Preoxygenation: Pre-oxygenation with 100% oxygen Induction Type: IV induction, Rapid sequence and Cricoid Pressure applied Laryngoscope Size: Mac and 3 Grade View: Grade I Tube type: Oral Tube size: 7.0 mm Number of attempts: 1 Airway Equipment and Method: Stylet Placement Confirmation: ETT inserted through vocal cords under direct vision,  positive ETCO2,  CO2 detector and breath sounds checked- equal and bilateral Secured at: 21 cm Tube secured with: Tape Dental Injury: Teeth and Oropharynx as per pre-operative assessment

## 2017-12-10 NOTE — Progress Notes (Signed)
S: feeling great, no pain, ambulating, voiding, has not required any pain meds  O: pressure dressing removed      Wound oozing bloody fluid +++      3 staples removed and incision packed with 1/2 packing      Wound redressed  Hgb has remained the same  A: 7 days post-cesarean with wound hematoma. S/p Wound revision in OR by Dr Simona Huh with evacuation of 150 cc hematoma and confirmed intact fascia.  P: will observe overnight       Repeat CBC in am      Will Check PT/PTT

## 2017-12-10 NOTE — MAU Note (Signed)
Pt states she had a c/s on 1/24. States tonight she started having a lot of incisional pain that is sharp and more so on the right side. States she took ibuprofen 600mg  at 10:30p and again at 11:45p because the pain was unbearable. States she has talked to the doctor on call and was told to try and sleep it off. States soon after getting off the phone she started having bleeding at the incision site. Pt states she had a normal BM tonight. Denies vaginal bleeding or discharge.

## 2017-12-10 NOTE — Anesthesia Postprocedure Evaluation (Signed)
Anesthesia Post Note  Patient: Alice Nash  Procedure(s) Performed: WOUND EXPLORATION (N/A Abdomen)     Anesthesia Type: General    Last Vitals:  Vitals:   12/10/17 1041 12/10/17 1200  BP: (!) 160/79 (!) 156/78  Pulse: 80 68  Resp: 16 20  Temp:  37.2 C  SpO2:  100%    Last Pain:  Vitals:   12/10/17 1202  TempSrc:   PainSc: 0-No pain   Pain Goal:                 [employer]

## 2017-12-11 ENCOUNTER — Encounter (HOSPITAL_COMMUNITY): Payer: Self-pay | Admitting: Obstetrics and Gynecology

## 2017-12-11 LAB — CBC
HEMATOCRIT: 24.2 % — AB (ref 36.0–46.0)
HEMOGLOBIN: 8.3 g/dL — AB (ref 12.0–15.0)
MCH: 30 pg (ref 26.0–34.0)
MCHC: 34.3 g/dL (ref 30.0–36.0)
MCV: 87.4 fL (ref 78.0–100.0)
Platelets: 190 10*3/uL (ref 150–400)
RBC: 2.77 MIL/uL — ABNORMAL LOW (ref 3.87–5.11)
RDW: 15.5 % (ref 11.5–15.5)
WBC: 10.9 10*3/uL — ABNORMAL HIGH (ref 4.0–10.5)

## 2017-12-11 MED ORDER — OXYCODONE HCL 5 MG PO TABS: 5.0000 mg | ORAL_TABLET | Freq: Four times a day (QID) | ORAL | 0 refills | Status: DC | PRN

## 2017-12-11 NOTE — Op Note (Signed)
NAME:  Alice Nash, Alice Nash            ACCOUNT NO.:  [id number]  MEDICAL RECORD NO.:  [id number]  LOCATION:                                 FACILITY:  PHYSICIAN:  Jola Schmidt, MD        DATE OF BIRTH:  DATE OF PROCEDURE:  12/10/2017 DATE OF DISCHARGE:                              OPERATIVE REPORT   PREOPERATIVE DIAGNOSIS:  Wound hematoma, status post cesarean section.  POSTOPERATIVE DIAGNOSIS:  Wound hematoma, status post cesarean section.  PROCEDURE PERFORMED:  Wound exploration and evacuation of hematoma.  SURGEON:  Jola Schmidt, MD.  ASSISTANT:  Irene Shipper.  ANESTHESIA:  General.  ESTIMATED BLOOD LOSS:  150 (including the clot present when the wound was opened).  DRAINS:  None.  LOCAL:  None.  SPECIMEN:  None.  COUNTS CORRECT:  Yes.  PATIENT DISPOSITION:  To PACU, hemodynamically stable.  COMPLICATIONS:  None.  FINDINGS:  Probably 75-100 mL of blood in the subcutaneous space. Fascia intact and site of bleeding on the right side and the subcu tissue oozing.  No purulent discharge.  No odor.  No signs of infection.  INDICATIONS:  Alice Nash is postoperative day 8 from a primary C- section.  Her postoperative course was complicated by postop ileus.  She also had gestational hypertension.  On the day of surgery, she called and felt she had been doing well, had not even required Percocet for pain, but on the day of surgery, she felt a pop and then had a sudden onset of pain.  Upon arrival, she had a large hematoma in the incision and once opened at the bedside, it was pretty extensive, so it was decided to take her back for sedation and examine the wound.  DESCRIPTION OF PROCEDURE:  The patient was taken to the operating room. She underwent general anesthesia without complication.  She was prepped and draped in normal sterile fashion.  A time-out was taken.  The remainder of the incision was opened with the scalpel and a large amount of clot came out.   The wound was dried off with the laparotomy sponges then at the base above the fascia was the site of bleeding that was cauterized with the Bovie that slowed down and held some pressure and that seemed to help, but the wound itself was just kind of oozing.  I used Bug Juice, which is antibiotic infused irrigation and we copiously cleaned the wound then closed it with 2-0 plain gut.  I did attempt to do a figure-of-eight in the area where the bleeding initially was and the stitch tore through, that was after the Bovie did not hold, but prior to closing with the 2-0 plain gut, tamponade was held for approximately 3 to 5 minutes and it seemed to respond very well.  The Arista was then used to apply in the wound.  Then, there was 1 layer of plain gut that was placed and then she just continued to have some oozing even at the skin she had oozing and Bovie was used, then I put 2 layers on the top, these are interrupted sutures of plain gut and then I placed 2 sutures above that and then we  did staples and we applied pressure.  Again, it seemed to respond well to pressure.  We did have a PICO initially and wound VAC, but due to no signs of infection, we decided to close, so a honeycomb dressing was applied and then pressure dressing was applied.  Once the patient was finished, there was some labial edema noted, but the mons was now soft.  Her abdomen was also soft and again the fascia was probed with the finger and there was no defect and the knot was very well visualized.  All instrument, sponge, and needle counts were correct x3.  The patient was taken to the recovery room in stable condition.     Jola Schmidt, MD     EBV/MEDQ  D:  12/10/2017  T:  12/11/2017  Job:  [id number]

## 2017-12-11 NOTE — Progress Notes (Signed)
All discharge instructions completed with the patient. All printed discharge instructions given and explained to the patient. The patient verbalizes an understanding of all discharge teaching. No questions or concerns voiced at this time.

## 2017-12-11 NOTE — Progress Notes (Signed)
Progress Note  S:  Patient resting comfortable in bed.  Pain controlled.  Tolerating general diet. Lochia minimal.  Ambulating without difficulty.  She denies n/v/f/c, SOB, or CP.  Dressing in place and appears dry.  O: Temp:  [98.2 F (36.8 C)-100 F (37.8 C)] 98.6 F (37 C) (02/02 0418) Pulse Rate:  [68-94] 88 (02/02 0418) Resp:  [16-20] 19 (02/02 0418) BP: (134-160)/(72-88) 143/86 (02/02 0418) SpO2:  [100 %] 100 % (02/02 0418) Gen: A&Ox3, NAD CV: RRR Resp: CTAB Abdomen: soft, NT, ND, Dressing removed this am- outside dressing dry, 4x4 grauze less than 50% soaked with serous fluid.  Incision intact- with palpation of uterus- no active drainage noted.  Gauze and dressing replaced Uterus: firm, non-tender, below umbilicus Ext: No edema, no calf tenderness bilaterally Labs:  Recent Labs    12/10/17 1003 12/11/17 0516  HGB 8.3* 8.3*    A/P: Pt is a 32 y.o. G1P1001- s/p hematoma evacuation/wound revision POD#1 following C-section on 1/24  -Incision healing appropriately, no evidence of infection or active drainage - Pain well controlled -GU: Voiding freely -GI: Tolerating general diet -Activity: encouraged sitting up to chair and ambulation as tolerated -Prophylaxis: early ambulation -Labs: stable as above  Plan for discharge home today with close outpatient follow up- scheduled for dressing change on 2/4  Janyth Pupa, DO [phone] (pager) [phone] (office)

## 2017-12-11 NOTE — Discharge Instructions (Signed)

## 2017-12-12 NOTE — Discharge Summary (Signed)
Physician Discharge Summary  Patient ID: Alice Nash: [id number] DOB/AGE: 05-08-1986 32 y.o.  Admit date: 12/10/2017 Discharge date: 12/11/2017  Admission Diagnoses: Wound hematoma  Discharge Diagnoses:  Active Problems:   Wound hematoma following cesarean section, postpartum   Discharged Condition: stable  Hospital Course: 32yo G1P1001 admitted s/p wound exploration and evacuation of hematoma.  The patient was observed overnight to monitor dressing and ensure proper postpartum care.  She remained stable with no further evidence of active drainage from her incision.  She was sent home on POD#1  Consults: None  Significant Diagnostic Studies: labs:  CBC Latest Ref Rng & Units 12/11/2017 12/10/2017 12/10/2017  WBC 4.0 - 10.5 K/uL 10.9(H) 10.4 11.5(H)  Hemoglobin 12.0 - 15.0 g/dL 8.3(L) 8.3(L) 8.6(L)  Hematocrit 36.0 - 46.0 % 24.2(L) 24.5(L) 25.3(L)  Platelets 150 - 400 K/uL 190 170 153     Treatments: antibiotics: Ancef and surgery: wound exploration and evacuation of wound hematoma  Discharge Exam: Blood pressure (!) 142/83, pulse 100, temperature 99.2 F (37.3 C), temperature source Oral, resp. rate 18, height 5\' 4"  (1.626 m), weight 89.8 kg (198 lb), SpO2 100 %, unknown if currently breastfeeding. Gen: A&Ox3, NAD CV: RRR Resp: CTAB Abdomen: soft, NT, ND, Dressing removed this am- outside dressing dry, 4x4 grauze less than 50% soaked with serous fluid.  Incision intact- with palpation of uterus- no active drainage noted.  Gauze and dressing replaced Uterus: firm, non-tender, below umbilicus Ext: No edema, no calf tenderness bilaterally    Disposition: 01-Home or Self Care   Allergies as of 12/11/2017   No Known Allergies     Medication List    STOP taking these medications   hydrocortisone-pramoxine rectal foam Commonly known as:  PROCTOFOAM HC   ibuprofen 600 MG tablet Commonly known as:  ADVIL,MOTRIN   NIFEdipine 30 MG 24 hr tablet Commonly known as:   PROCARDIA-XL/ADALAT CC     TAKE these medications   oxyCODONE 5 MG immediate release tablet Commonly known as:  Oxy IR/ROXICODONE Take 1 tablet (5 mg total) by mouth every 6 (six) hours as needed for moderate pain (for pain scale 4-7).   prenatal multivitamin Tabs tablet Take 1 tablet by mouth daily at 12 noon.      Follow-up Information    Delsa Bern, MD Follow up.   Specialty:  Obstetrics and Gynecology Contact information: [address] [place] [address] [phone]           Signed: Annalee Genta 12/12/2017, 11:30 AM

## 2017-12-13 DIAGNOSIS — Z4801 Encounter for change or removal of surgical wound dressing: Secondary | ICD-10-CM | POA: Diagnosis not present

## 2017-12-13 DIAGNOSIS — I10 Essential (primary) hypertension: Secondary | ICD-10-CM | POA: Diagnosis not present

## 2017-12-16 DIAGNOSIS — Z4801 Encounter for change or removal of surgical wound dressing: Secondary | ICD-10-CM | POA: Diagnosis not present

## 2017-12-23 DIAGNOSIS — R030 Elevated blood-pressure reading, without diagnosis of hypertension: Secondary | ICD-10-CM | POA: Diagnosis not present

## 2017-12-23 DIAGNOSIS — Z4801 Encounter for change or removal of surgical wound dressing: Secondary | ICD-10-CM | POA: Diagnosis not present

## 2017-12-23 DIAGNOSIS — K649 Unspecified hemorrhoids: Secondary | ICD-10-CM | POA: Diagnosis not present

## 2017-12-27 DIAGNOSIS — R600 Localized edema: Secondary | ICD-10-CM | POA: Diagnosis not present

## 2017-12-27 DIAGNOSIS — Z4801 Encounter for change or removal of surgical wound dressing: Secondary | ICD-10-CM | POA: Diagnosis not present

## 2018-01-03 DIAGNOSIS — Z4801 Encounter for change or removal of surgical wound dressing: Secondary | ICD-10-CM | POA: Diagnosis not present

## 2018-01-05 DIAGNOSIS — Z4801 Encounter for change or removal of surgical wound dressing: Secondary | ICD-10-CM | POA: Diagnosis not present

## 2018-01-07 DIAGNOSIS — Z4801 Encounter for change or removal of surgical wound dressing: Secondary | ICD-10-CM | POA: Diagnosis not present

## 2018-04-24 DIAGNOSIS — J069 Acute upper respiratory infection, unspecified: Secondary | ICD-10-CM | POA: Diagnosis not present

## 2018-04-24 DIAGNOSIS — R05 Cough: Secondary | ICD-10-CM | POA: Diagnosis not present

## 2018-05-21 ENCOUNTER — Encounter (HOSPITAL_COMMUNITY): Payer: Self-pay | Admitting: Emergency Medicine

## 2018-05-21 ENCOUNTER — Emergency Department (HOSPITAL_COMMUNITY)
Admission: EM | Admit: 2018-05-21 | Discharge: 2018-05-21 | Disposition: A | Discharge status: Home / Self Care | Payer: BLUE CROSS/BLUE SHIELD | Attending: Emergency Medicine | Admitting: Emergency Medicine

## 2018-05-21 DIAGNOSIS — Z79899 Other long term (current) drug therapy: Secondary | ICD-10-CM | POA: Insufficient documentation

## 2018-05-21 DIAGNOSIS — B309 Viral conjunctivitis, unspecified: Secondary | ICD-10-CM

## 2018-05-21 DIAGNOSIS — B308 Other viral conjunctivitis: Secondary | ICD-10-CM | POA: Diagnosis not present

## 2018-05-21 DIAGNOSIS — E860 Dehydration: Secondary | ICD-10-CM

## 2018-05-21 DIAGNOSIS — H1033 Unspecified acute conjunctivitis, bilateral: Secondary | ICD-10-CM | POA: Diagnosis not present

## 2018-05-21 DIAGNOSIS — R55 Syncope and collapse: Secondary | ICD-10-CM

## 2018-05-21 LAB — BASIC METABOLIC PANEL
Anion gap: 10 (ref 5–15)
BUN: 10 mg/dL (ref 6–20)
CALCIUM: 9.2 mg/dL (ref 8.9–10.3)
CHLORIDE: 104 mmol/L (ref 98–111)
CO2: 22 mmol/L (ref 22–32)
Creatinine, Ser: 0.72 mg/dL (ref 0.44–1.00)
GFR calc Af Amer: >60 mL/min (ref >60)
GFR calc non Af Amer: >60 mL/min (ref >60)
GLUCOSE: 106 mg/dL — AB (ref 70–99)
Potassium: 3.3 mmol/L — ABNORMAL LOW (ref 3.5–5.1)
Sodium: 136 mmol/L (ref 135–145)

## 2018-05-21 LAB — URINALYSIS, ROUTINE W REFLEX MICROSCOPIC
BACTERIA UA: NONE SEEN
BILIRUBIN URINE: NEGATIVE
Glucose, UA: NEGATIVE mg/dL
KETONES UR: 20 mg/dL — AB
Leukocytes, UA: NEGATIVE
Nitrite: NEGATIVE
PH: 5 (ref 5.0–8.0)
PROTEIN: 100 mg/dL — AB
Specific Gravity, Urine: 1.027 (ref 1.005–1.030)

## 2018-05-21 LAB — CBC
HCT: 38 % (ref 36.0–46.0)
HEMOGLOBIN: 12.5 g/dL (ref 12.0–15.0)
MCH: 28 pg (ref 26.0–34.0)
MCHC: 32.9 g/dL (ref 30.0–36.0)
MCV: 85 fL (ref 78.0–100.0)
Platelets: 226 10*3/uL (ref 150–400)
RBC: 4.47 MIL/uL (ref 3.87–5.11)
RDW: 14.6 % (ref 11.5–15.5)
WBC: 9.6 10*3/uL (ref 4.0–10.5)

## 2018-05-21 LAB — LACTIC ACID, PLASMA: Lactic Acid, Venous: 0.8 mmol/L (ref 0.5–1.9)

## 2018-05-21 LAB — I-STAT BETA HCG BLOOD, ED (MC, WL, AP ONLY)

## 2018-05-21 LAB — D-DIMER, QUANTITATIVE: D-Dimer, Quant: <0.27 ug/mL-FEU (ref 0.00–0.50)

## 2018-05-21 LAB — CBG MONITORING, ED: GLUCOSE-CAPILLARY: 98 mg/dL (ref 70–99)

## 2018-05-21 MED ORDER — SODIUM CHLORIDE 0.9 % IV BOLUS
1000.0000 mL | Freq: Once | INTRAVENOUS | Status: AC
  Administered 2018-05-21: 1000 mL via INTRAVENOUS

## 2018-05-21 NOTE — ED Triage Notes (Signed)
Pt reports her husband found her passed out in the bathroom about 1 hour pta, states that he said she was unconscious for a few seconds. Pt denies any pain, no hx of syncopal episodes. Reports itching and draining of bilateral eyes and fever of 100-101 earlier today, states son has recently been sick with virus. Pt a/ox4, resp e/u, nad.

## 2018-05-21 NOTE — ED Notes (Signed)
Patient tolerating PO intake without complaint.

## 2018-05-21 NOTE — ED Provider Notes (Signed)
[facility] EMERGENCY DEPARTMENT Provider Note   CSN: [id number] Arrival date & time: 05/21/18  0439     History   Chief Complaint Chief Complaint  Patient presents with  . Loss of Consciousness    HPI Scherry Laverne is a 32 y.o. female with no significant past medical history who presents after loss of consciousness.  Patient reports that 3 days prior to presentation, she started to have bilateral watery, "crusty" eyes. She checked her temperature yesterday and found that she had a fever of 101F. She then woke up this morning at 2 am with abdominal pain. She stood up from the toiled and loss consiousness. Her husband reports that she was unconsious for several seconds.   She denies nausea, vomiting, diarrhea. She denies hitting her head. She denies any pain. No prodromal symptoms (dizziness, light-headedness).  She reports that her son has been "sick with a virus."  Past Medical History:  Diagnosis Date  . Fibroid     Patient Active Problem List   Diagnosis Date Noted  . Wound hematoma following cesarean section, postpartum 12/10/2017  . Influenza A 12/05/2017  . Status post primary low transverse cesarean section 12/04/2017  . Ileus ([place]) 12/04/2017  . Chronic benign essential hypertension in third trimester 11/30/2017    Past Surgical History:  Procedure Laterality Date  . CESAREAN SECTION N/A 12/02/2017   Procedure: CESAREAN SECTION;  Surgeon: Delsa Bern, MD;  Location: [facility];  Service: Obstetrics;  Laterality: N/A;  . WISDOM TOOTH EXTRACTION    . WOUND EXPLORATION N/A 12/10/2017   Procedure: WOUND EXPLORATION;  Surgeon: Thurnell Lose, MD;  Location: [facility];  Service: Gynecology;  Laterality: N/A;     OB History    Gravida  1   Para  1   Term  1   Preterm      AB      Living        SAB      TAB      Ectopic      Multiple      Live Births  0            Home Medications    Prior to Admission  medications   Medication Sig Start Date End Date Taking? Authorizing Provider  JENCYCLA 0.35 MG tablet Take 1 tablet by mouth daily. 04/30/18  Yes [provider]  oxyCODONE (OXY IR/ROXICODONE) 5 MG immediate release tablet Take 1 tablet (5 mg total) by mouth every 6 (six) hours as needed for moderate pain (for pain scale 4-7). Patient not taking: Reported on 05/21/2018 12/11/17   Janyth Pupa, DO    Family History Family History  Problem Relation Age of Onset  . Hypertension Mother   . Kidney cancer Father     Social History Social History   Tobacco Use  . Smoking status: Never Smoker  . Smokeless tobacco: Never Used  Substance Use Topics  . Alcohol use: No    Frequency: Never  . Drug use: No     Allergies   Patient has no known allergies.   Review of Systems Review of Systems  Constitutional: Negative for activity change, appetite change, chills, fatigue and fever.  HENT: Negative for congestion and sore throat.   Respiratory: Negative for cough, chest tightness and shortness of breath.   Cardiovascular: Negative for chest pain and leg swelling.  Gastrointestinal: Negative for abdominal distention, abdominal pain, diarrhea, nausea and vomiting.  Genitourinary: Negative for difficulty urinating, dysuria, hematuria  and urgency.  Skin: Negative for rash.  Neurological: Negative for dizziness, numbness and headaches.  All other systems reviewed and are negative.    Physical Exam Updated Vital Signs BP (!) 143/96 (BP Location: Right Arm)   Pulse (!) 121   Temp 99 F (37.2 C) (Oral)   Resp 18   SpO2 96%   Physical Exam  Constitutional: She appears well-developed and well-nourished.  HENT:  Head: Normocephalic and atraumatic.  Eyes: Pupils are equal, round, and reactive to light. EOM and lids are normal. Right eye exhibits discharge (clear). Left eye exhibits discharge (clear). Right conjunctiva is injected. Left conjunctiva is injected.  Cardiovascular:  Normal rate, normal heart sounds and intact distal pulses.  Tachycardic at 100  Pulmonary/Chest: Effort normal and breath sounds normal.  Abdominal: Soft. Bowel sounds are normal.  Neurological: She is alert.  Skin: Skin is warm and dry.  Psychiatric: She has a normal mood and affect. Her behavior is normal.  Nursing note and vitals reviewed.    ED Treatments / Results  Labs (all labs ordered are listed, but only abnormal results are displayed) Labs Reviewed  BASIC METABOLIC PANEL - Abnormal; Notable for the following components:      Result Value   Potassium 3.3 (*)    Glucose, Bld 106 (*)    All other components within normal limits  URINALYSIS, ROUTINE W REFLEX MICROSCOPIC - Abnormal; Notable for the following components:   APPearance HAZY (*)    Hgb urine dipstick SMALL (*)    Ketones, ur 20 (*)    Protein, ur 100 (*)    All other components within normal limits  CBC  LACTIC ACID, PLASMA  D-DIMER, QUANTITATIVE (NOT AT [facility])  LACTIC ACID, PLASMA  CBG MONITORING, ED  I-STAT BETA HCG BLOOD, ED ([facility], [facility], [facility] ONLY)    EKG EKG Interpretation  Date/Time:  Saturday May 21 2018 04:57:22 EDT Ventricular Rate:  116 PR Interval:  172 QRS Duration: 78 QT Interval:  320 QTC Calculation: 444 R Axis:   7 Text Interpretation:  Poor data quality, interpretation may be adversely affected Sinus tachycardia Cannot rule out Anterior infarct , age undetermined Abnormal ECG Confirmed by Pattricia Boss [phone]) on 05/21/2018 7:42:32 AM  Radiology No results found.  Procedures Procedures (including critical care time)  Medications Ordered in ED Medications - No data to display   Initial Impression / Assessment and Plan / ED Course  I have reviewed the triage vital signs and the nursing notes.  Pertinent labs & imaging results that were available during my care of the patient were reviewed by me and considered in my medical decision making (see chart for details).  Kaylei Frink  is a 32 y.o. female with no significant past medical history who presents after loss of consciousness after standing up from the commode and found to have tachycardia (100's-120's) with unremarkable blood pressure (140's/70's).   #Loss of consciousness: - Patient's symptoms are most likely secondary to vasovagal syncope given constellation of symptoms. She did not have orthostatic hypotension in the ED and CBC unremarkable (lowers suspicion for anemia). Pulmonary embolism should be considered in the differential given that she is a young female on estrogen-containing birth control but is less likely given normal d-dimer.  #Tachycardia - Low suspicion of PE per above. Most likely secondary to viral illness and recent sick contact. - Gave 2 L of NS. Heart rate remained 100-110. - EKG: sinus tachycardia  #Proteinuria - Creatinine within normal limits: 0.72 - BUN/Creatinine:  13.88 decreasing the likelihood of hypovolemia leading to pre-renal disease. Differential diagnosis should also include glomerulonephritis, nephrotic syndrome, ATN.  - Recommended following up with PCP for repeat urinalysis and further urine sediment studies.  #Watery and itchy eyes - Likely secondary to allergic conjunctivitis.  - Recommended over the counter antihistamine drop.   #Dispo - Home  Final Clinical Impressions(s) / ED Diagnoses   Final diagnoses:  Dehydration  Acute viral conjunctivitis, unspecified laterality  Micturition syncope    ED Discharge Orders    None       Carroll Sage, MD 05/21/18 1713    Pattricia Boss, MD 05/22/18 [phone]

## 2018-05-21 NOTE — ED Notes (Signed)
Pt in F1 to pump. RN aware

## 2018-05-21 NOTE — Discharge Instructions (Addendum)
For your viral conjunctivitis, please use an over the counter antihistamine drop. Some examples include:  Zaditor Antihistamine Eye Drops    Alaway antihistamine drops  Please use 1-2 drops four times per day as needed for no more than three weeks. If symptoms do not improve, please see your primary care doctor.  Please drink lots of fluids (at least 6-8 glasses of water per day).  Follow up with your PCP for recheck of your urinalysis.

## 2018-05-21 NOTE — ED Provider Notes (Signed)
32 year old female with conjunctivitis and URI symptoms who presents today after syncopal episode.  This occurred after her voiding.  She did not have a prodrome.  Here she is somewhat tachycardic.  EKG shows a sinus tachycardia.  Labs are being checked.  Plan to evaluate and d-dimer and lactic acid.  She is receiving IV fluids. Plan reevaluation and d/c if improved.    Pattricia Boss, MD 05/21/18 1159

## 2018-05-24 DIAGNOSIS — B309 Viral conjunctivitis, unspecified: Secondary | ICD-10-CM | POA: Diagnosis not present

## 2018-05-27 DIAGNOSIS — H1089 Other conjunctivitis: Secondary | ICD-10-CM | POA: Diagnosis not present

## 2018-05-27 DIAGNOSIS — I10 Essential (primary) hypertension: Secondary | ICD-10-CM | POA: Diagnosis not present

## 2018-05-27 DIAGNOSIS — Z8669 Personal history of other diseases of the nervous system and sense organs: Secondary | ICD-10-CM | POA: Diagnosis not present

## 2018-07-01 DIAGNOSIS — I10 Essential (primary) hypertension: Secondary | ICD-10-CM | POA: Diagnosis not present

## 2018-08-08 DIAGNOSIS — E663 Overweight: Secondary | ICD-10-CM | POA: Diagnosis not present

## 2018-08-08 DIAGNOSIS — I10 Essential (primary) hypertension: Secondary | ICD-10-CM | POA: Diagnosis not present

## 2018-08-08 DIAGNOSIS — Z6830 Body mass index (BMI) 30.0-30.9, adult: Secondary | ICD-10-CM | POA: Diagnosis not present

## 2018-11-24 DIAGNOSIS — I10 Essential (primary) hypertension: Secondary | ICD-10-CM | POA: Diagnosis not present

## 2019-01-20 DIAGNOSIS — Z6831 Body mass index (BMI) 31.0-31.9, adult: Secondary | ICD-10-CM | POA: Diagnosis not present

## 2019-01-20 DIAGNOSIS — Z308 Encounter for other contraceptive management: Secondary | ICD-10-CM | POA: Diagnosis not present

## 2019-01-20 DIAGNOSIS — Z01419 Encounter for gynecological examination (general) (routine) without abnormal findings: Secondary | ICD-10-CM | POA: Diagnosis not present

## 2019-05-02 DIAGNOSIS — R05 Cough: Secondary | ICD-10-CM | POA: Diagnosis not present

## 2019-05-03 DIAGNOSIS — R05 Cough: Secondary | ICD-10-CM | POA: Diagnosis not present

## 2019-05-26 DIAGNOSIS — I10 Essential (primary) hypertension: Secondary | ICD-10-CM | POA: Diagnosis not present

## 2020-01-15 IMAGING — DX DG ABDOMEN 1V
2 series · 2 of 2 positions shown · non-contrast
Comparison: None.

CLINICAL DATA: Nausea and vomiting. Postoperative cesarean section
from 12/02/2017.

EXAM:
ABDOMEN - 1 VIEW

[abdomen kub (1 of 2)]
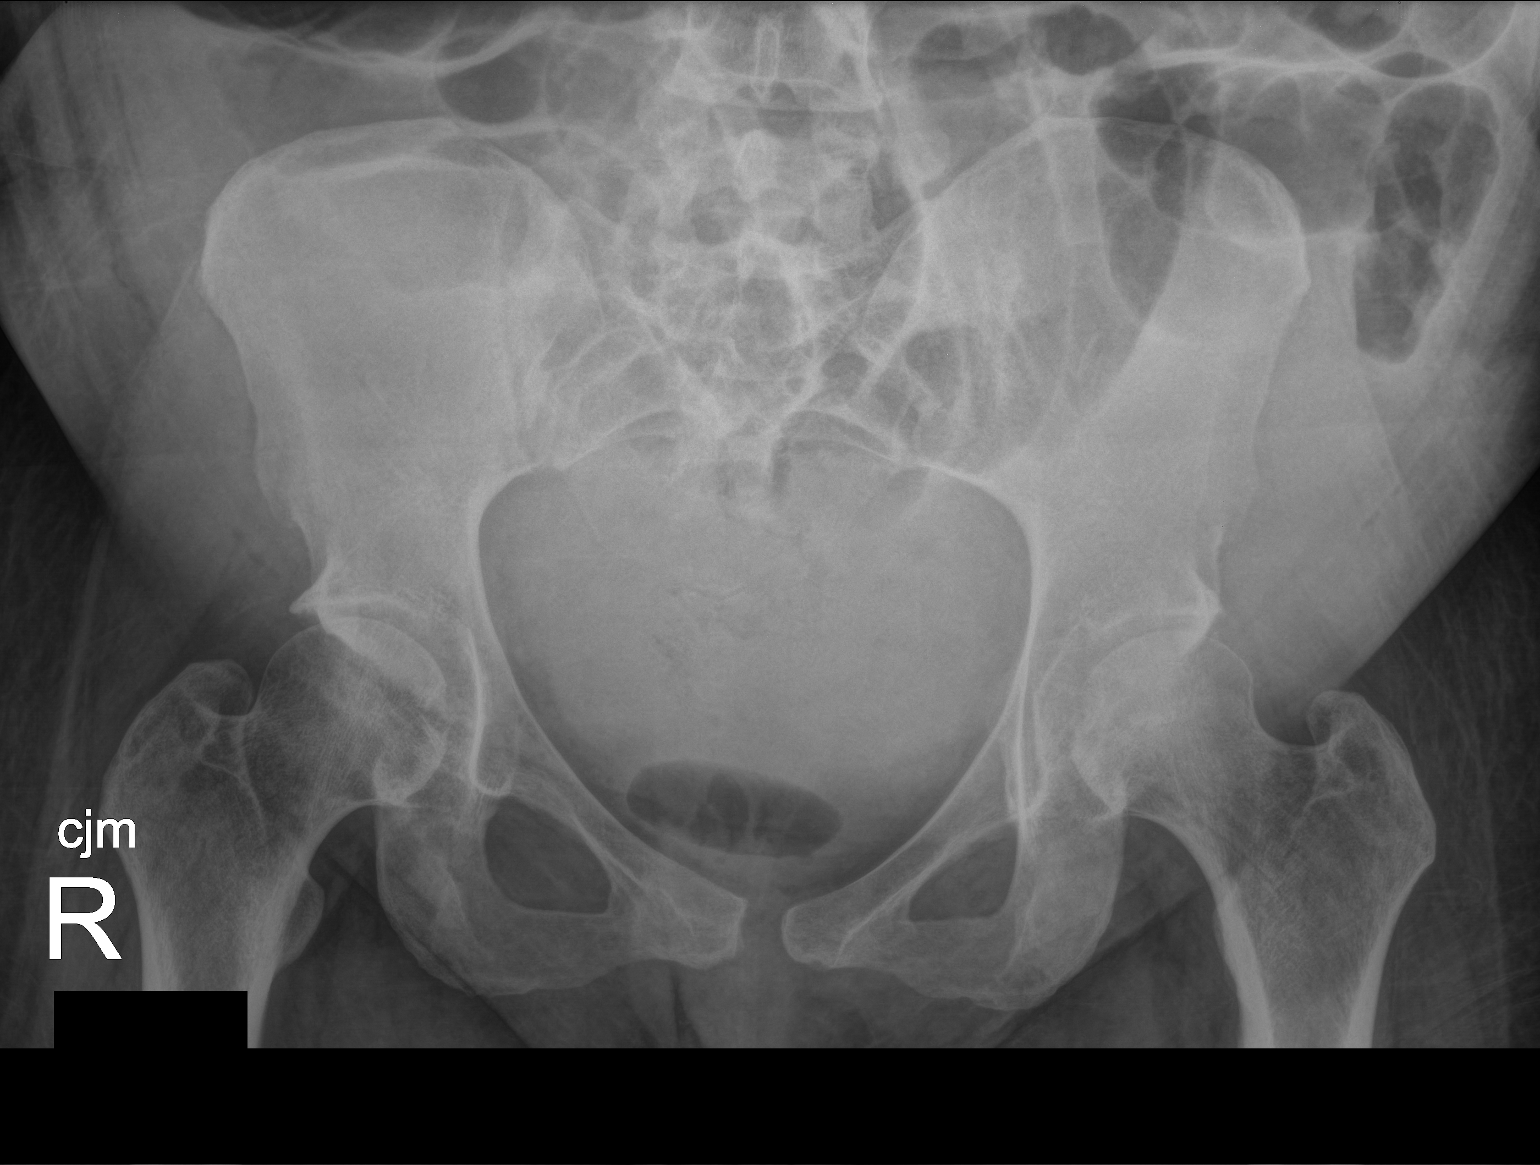

[abdomen kub (2 of 2)]
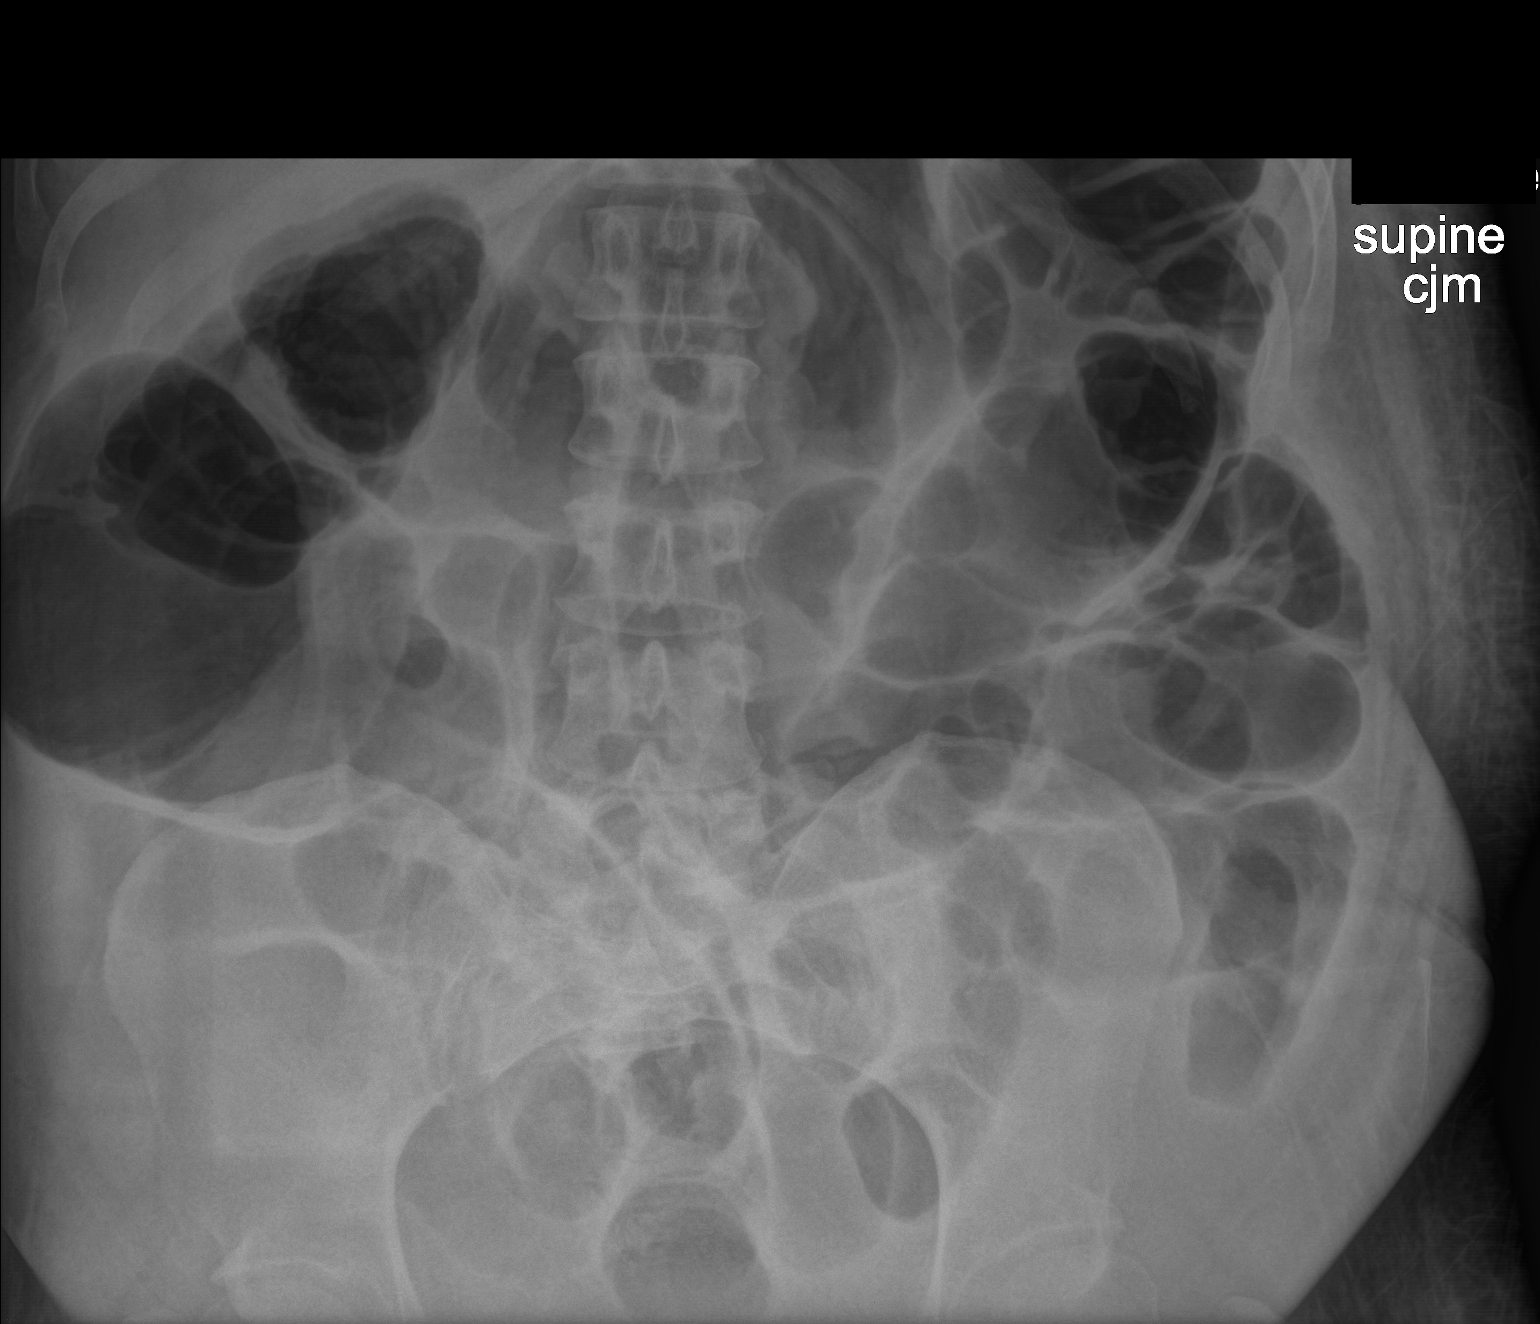

[2 of 2 positions shown; findings below may reference images not displayed]

FINDINGS: Gas-filled dilated small and large bowel, likely representing ileus.
Stool and gas in the rectum. No radiopaque stones. Visualized bones
appear intact.
IMPRESSION: Gas-filled dilated large and small bowel likely representing ileus.

## 2020-04-18 DIAGNOSIS — Z6831 Body mass index (BMI) 31.0-31.9, adult: Secondary | ICD-10-CM | POA: Diagnosis not present

## 2020-04-18 DIAGNOSIS — Z124 Encounter for screening for malignant neoplasm of cervix: Secondary | ICD-10-CM | POA: Diagnosis not present

## 2020-04-18 DIAGNOSIS — Z01419 Encounter for gynecological examination (general) (routine) without abnormal findings: Secondary | ICD-10-CM | POA: Diagnosis not present

## 2020-04-18 DIAGNOSIS — Z3040 Encounter for surveillance of contraceptives, unspecified: Secondary | ICD-10-CM | POA: Diagnosis not present

## 2020-07-04 DIAGNOSIS — E663 Overweight: Secondary | ICD-10-CM | POA: Diagnosis not present

## 2020-07-04 DIAGNOSIS — Z0000 Encounter for general adult medical examination without abnormal findings: Secondary | ICD-10-CM | POA: Diagnosis not present

## 2020-07-04 DIAGNOSIS — I10 Essential (primary) hypertension: Secondary | ICD-10-CM | POA: Diagnosis not present

## 2021-03-21 DIAGNOSIS — I10 Essential (primary) hypertension: Secondary | ICD-10-CM | POA: Diagnosis not present

## 2021-03-21 DIAGNOSIS — E663 Overweight: Secondary | ICD-10-CM | POA: Diagnosis not present

## 2021-05-09 DIAGNOSIS — Z01419 Encounter for gynecological examination (general) (routine) without abnormal findings: Secondary | ICD-10-CM | POA: Diagnosis not present

## 2021-05-09 DIAGNOSIS — Z308 Encounter for other contraceptive management: Secondary | ICD-10-CM | POA: Diagnosis not present

## 2021-10-24 DIAGNOSIS — E669 Obesity, unspecified: Secondary | ICD-10-CM | POA: Diagnosis not present

## 2021-10-24 DIAGNOSIS — Z13220 Encounter for screening for lipoid disorders: Secondary | ICD-10-CM | POA: Diagnosis not present

## 2021-10-24 DIAGNOSIS — I10 Essential (primary) hypertension: Secondary | ICD-10-CM | POA: Diagnosis not present

## 2021-11-25 DIAGNOSIS — E669 Obesity, unspecified: Secondary | ICD-10-CM | POA: Diagnosis not present

## 2022-04-23 DIAGNOSIS — M542 Cervicalgia: Secondary | ICD-10-CM | POA: Diagnosis not present

## 2022-04-23 DIAGNOSIS — E669 Obesity, unspecified: Secondary | ICD-10-CM | POA: Diagnosis not present

## 2022-04-23 DIAGNOSIS — Z6833 Body mass index (BMI) 33.0-33.9, adult: Secondary | ICD-10-CM | POA: Diagnosis not present

## 2022-04-23 DIAGNOSIS — I10 Essential (primary) hypertension: Secondary | ICD-10-CM | POA: Diagnosis not present

## 2023-04-16 DIAGNOSIS — N912 Amenorrhea, unspecified: Secondary | ICD-10-CM | POA: Diagnosis not present

## 2023-05-12 DIAGNOSIS — O3680X9 Pregnancy with inconclusive fetal viability, other fetus: Secondary | ICD-10-CM | POA: Diagnosis not present

## 2023-05-12 DIAGNOSIS — Z3481 Encounter for supervision of other normal pregnancy, first trimester: Secondary | ICD-10-CM | POA: Diagnosis not present

## 2023-05-12 DIAGNOSIS — Z9889 Other specified postprocedural states: Secondary | ICD-10-CM | POA: Diagnosis not present

## 2023-05-12 DIAGNOSIS — I87309 Chronic venous hypertension (idiopathic) without complications of unspecified lower extremity: Secondary | ICD-10-CM | POA: Diagnosis not present

## 2023-05-12 DIAGNOSIS — Z3A01 Less than 8 weeks gestation of pregnancy: Secondary | ICD-10-CM | POA: Diagnosis not present

## 2023-05-12 DIAGNOSIS — O09521 Supervision of elderly multigravida, first trimester: Secondary | ICD-10-CM | POA: Diagnosis not present

## 2023-05-12 DIAGNOSIS — N925 Other specified irregular menstruation: Secondary | ICD-10-CM | POA: Diagnosis not present

## 2023-05-12 LAB — OB RESULTS CONSOLE ANTIBODY SCREEN: Antibody Screen: NEGATIVE

## 2023-05-16 LAB — OB RESULTS CONSOLE GC/CHLAMYDIA
Chlamydia: NEGATIVE
Neisseria Gonorrhea: NEGATIVE

## 2023-05-16 LAB — OB RESULTS CONSOLE RUBELLA ANTIBODY, IGM: Rubella: IMMUNE

## 2023-05-16 LAB — OB RESULTS CONSOLE HEPATITIS B SURFACE ANTIGEN: Hepatitis B Surface Ag: NEGATIVE

## 2023-05-16 LAB — OB RESULTS CONSOLE HIV ANTIBODY (ROUTINE TESTING): HIV: NONREACTIVE

## 2023-05-16 LAB — OB RESULTS CONSOLE RPR: RPR: NONREACTIVE

## 2023-06-08 DIAGNOSIS — R8271 Bacteriuria: Secondary | ICD-10-CM | POA: Diagnosis not present

## 2023-06-08 DIAGNOSIS — Z369 Encounter for antenatal screening, unspecified: Secondary | ICD-10-CM | POA: Diagnosis not present

## 2023-07-30 DIAGNOSIS — Z369 Encounter for antenatal screening, unspecified: Secondary | ICD-10-CM | POA: Diagnosis not present

## 2023-07-30 LAB — HEPATITIS C ANTIBODY: HCV Ab: NEGATIVE

## 2023-08-11 DIAGNOSIS — Z362 Encounter for other antenatal screening follow-up: Secondary | ICD-10-CM | POA: Diagnosis not present

## 2023-08-13 DIAGNOSIS — O09522 Supervision of elderly multigravida, second trimester: Secondary | ICD-10-CM | POA: Diagnosis not present

## 2023-08-13 DIAGNOSIS — D251 Intramural leiomyoma of uterus: Secondary | ICD-10-CM | POA: Diagnosis not present

## 2023-08-13 DIAGNOSIS — O3412 Maternal care for benign tumor of corpus uteri, second trimester: Secondary | ICD-10-CM | POA: Diagnosis not present

## 2023-08-13 DIAGNOSIS — O10012 Pre-existing essential hypertension complicating pregnancy, second trimester: Secondary | ICD-10-CM | POA: Diagnosis not present

## 2023-08-27 DIAGNOSIS — Z369 Encounter for antenatal screening, unspecified: Secondary | ICD-10-CM | POA: Diagnosis not present

## 2023-09-24 DIAGNOSIS — Z363 Encounter for antenatal screening for malformations: Secondary | ICD-10-CM | POA: Diagnosis not present

## 2023-09-24 DIAGNOSIS — Z3A28 28 weeks gestation of pregnancy: Secondary | ICD-10-CM | POA: Diagnosis not present

## 2023-10-25 DIAGNOSIS — Z3A32 32 weeks gestation of pregnancy: Secondary | ICD-10-CM | POA: Diagnosis not present

## 2023-10-25 DIAGNOSIS — O09519 Supervision of elderly primigravida, unspecified trimester: Secondary | ICD-10-CM | POA: Diagnosis not present

## 2023-10-25 DIAGNOSIS — Z364 Encounter for antenatal screening for fetal growth retardation: Secondary | ICD-10-CM | POA: Diagnosis not present

## 2023-11-05 DIAGNOSIS — Z364 Encounter for antenatal screening for fetal growth retardation: Secondary | ICD-10-CM | POA: Diagnosis not present

## 2023-11-05 DIAGNOSIS — O09521 Supervision of elderly multigravida, first trimester: Secondary | ICD-10-CM | POA: Diagnosis not present

## 2023-11-05 DIAGNOSIS — Z3A34 34 weeks gestation of pregnancy: Secondary | ICD-10-CM | POA: Diagnosis not present

## 2023-11-16 DIAGNOSIS — Z364 Encounter for antenatal screening for fetal growth retardation: Secondary | ICD-10-CM | POA: Diagnosis not present

## 2023-11-16 DIAGNOSIS — O09519 Supervision of elderly primigravida, unspecified trimester: Secondary | ICD-10-CM | POA: Diagnosis not present

## 2023-11-16 DIAGNOSIS — Z369 Encounter for antenatal screening, unspecified: Secondary | ICD-10-CM | POA: Diagnosis not present

## 2023-11-16 DIAGNOSIS — O139 Gestational [pregnancy-induced] hypertension without significant proteinuria, unspecified trimester: Secondary | ICD-10-CM | POA: Diagnosis not present

## 2023-11-16 DIAGNOSIS — Z3A35 35 weeks gestation of pregnancy: Secondary | ICD-10-CM | POA: Diagnosis not present

## 2023-11-16 DIAGNOSIS — Z23 Encounter for immunization: Secondary | ICD-10-CM | POA: Diagnosis not present

## 2023-11-16 LAB — OB RESULTS CONSOLE GBS: GBS: NEGATIVE

## 2023-11-23 DIAGNOSIS — Z23 Encounter for immunization: Secondary | ICD-10-CM | POA: Diagnosis not present

## 2023-11-23 DIAGNOSIS — Z364 Encounter for antenatal screening for fetal growth retardation: Secondary | ICD-10-CM | POA: Diagnosis not present

## 2023-11-23 DIAGNOSIS — Z3A36 36 weeks gestation of pregnancy: Secondary | ICD-10-CM | POA: Diagnosis not present

## 2023-11-23 DIAGNOSIS — O139 Gestational [pregnancy-induced] hypertension without significant proteinuria, unspecified trimester: Secondary | ICD-10-CM | POA: Diagnosis not present

## 2023-11-25 ENCOUNTER — Encounter (HOSPITAL_COMMUNITY): Payer: Self-pay | Admitting: *Deleted

## 2023-11-25 ENCOUNTER — Other Ambulatory Visit: Payer: Self-pay | Admitting: Obstetrics & Gynecology

## 2023-11-25 ENCOUNTER — Telehealth (HOSPITAL_COMMUNITY): Payer: Self-pay | Admitting: *Deleted

## 2023-11-25 NOTE — Telephone Encounter (Signed)
Preadmission screen  

## 2023-12-01 DIAGNOSIS — Z3A38 38 weeks gestation of pregnancy: Secondary | ICD-10-CM | POA: Diagnosis not present

## 2023-12-01 DIAGNOSIS — I87309 Chronic venous hypertension (idiopathic) without complications of unspecified lower extremity: Secondary | ICD-10-CM | POA: Diagnosis not present

## 2023-12-02 ENCOUNTER — Telehealth (HOSPITAL_COMMUNITY): Payer: Self-pay | Admitting: *Deleted

## 2023-12-02 NOTE — Telephone Encounter (Signed)
Preadmission screen  

## 2023-12-03 ENCOUNTER — Telehealth (HOSPITAL_COMMUNITY): Payer: Self-pay | Admitting: *Deleted

## 2023-12-03 ENCOUNTER — Encounter (HOSPITAL_COMMUNITY): Payer: Self-pay | Admitting: *Deleted

## 2023-12-03 NOTE — Telephone Encounter (Signed)
Preadmission screen

## 2023-12-08 ENCOUNTER — Inpatient Hospital Stay (HOSPITAL_COMMUNITY): Payer: BC Managed Care – PPO | Admitting: Anesthesiology

## 2023-12-08 ENCOUNTER — Encounter (HOSPITAL_COMMUNITY): Payer: Self-pay | Admitting: Obstetrics & Gynecology

## 2023-12-08 ENCOUNTER — Encounter (HOSPITAL_COMMUNITY)
Admission: RE | Disposition: A | Discharge status: Home / Self Care | Payer: Self-pay | Source: Home / Self Care | Attending: Obstetrics & Gynecology

## 2023-12-08 ENCOUNTER — Inpatient Hospital Stay (HOSPITAL_COMMUNITY)
Admission: RE | Admit: 2023-12-08 | Discharge: 2023-12-11 | DRG: 787 | Disposition: A | Discharge status: Home / Self Care | Payer: BC Managed Care – PPO | Attending: Obstetrics & Gynecology | Admitting: Obstetrics & Gynecology

## 2023-12-08 ENCOUNTER — Other Ambulatory Visit: Payer: Self-pay

## 2023-12-08 ENCOUNTER — Encounter (HOSPITAL_COMMUNITY): Payer: BC Managed Care – PPO

## 2023-12-08 ENCOUNTER — Inpatient Hospital Stay (HOSPITAL_COMMUNITY): Payer: BC Managed Care – PPO

## 2023-12-08 DIAGNOSIS — Z3A39 39 weeks gestation of pregnancy: Secondary | ICD-10-CM | POA: Diagnosis not present

## 2023-12-08 DIAGNOSIS — O34211 Maternal care for low transverse scar from previous cesarean delivery: Secondary | ICD-10-CM | POA: Diagnosis present

## 2023-12-08 DIAGNOSIS — O322XX0 Maternal care for transverse and oblique lie, not applicable or unspecified: Principal | ICD-10-CM | POA: Diagnosis present

## 2023-12-08 DIAGNOSIS — O9902 Anemia complicating childbirth: Secondary | ICD-10-CM | POA: Diagnosis not present

## 2023-12-08 DIAGNOSIS — O34219 Maternal care for unspecified type scar from previous cesarean delivery: Secondary | ICD-10-CM | POA: Diagnosis not present

## 2023-12-08 DIAGNOSIS — D252 Subserosal leiomyoma of uterus: Secondary | ICD-10-CM | POA: Diagnosis not present

## 2023-12-08 DIAGNOSIS — O1092 Unspecified pre-existing hypertension complicating childbirth: Secondary | ICD-10-CM | POA: Diagnosis not present

## 2023-12-08 DIAGNOSIS — Z8249 Family history of ischemic heart disease and other diseases of the circulatory system: Secondary | ICD-10-CM

## 2023-12-08 DIAGNOSIS — O872 Hemorrhoids in the puerperium: Secondary | ICD-10-CM | POA: Diagnosis present

## 2023-12-08 DIAGNOSIS — O321XX0 Maternal care for breech presentation, not applicable or unspecified: Secondary | ICD-10-CM | POA: Diagnosis not present

## 2023-12-08 DIAGNOSIS — O3413 Maternal care for benign tumor of corpus uteri, third trimester: Secondary | ICD-10-CM | POA: Diagnosis present

## 2023-12-08 DIAGNOSIS — I10 Essential (primary) hypertension: Secondary | ICD-10-CM | POA: Diagnosis not present

## 2023-12-08 DIAGNOSIS — O1002 Pre-existing essential hypertension complicating childbirth: Secondary | ICD-10-CM | POA: Diagnosis not present

## 2023-12-08 DIAGNOSIS — Z23 Encounter for immunization: Secondary | ICD-10-CM | POA: Diagnosis not present

## 2023-12-08 DIAGNOSIS — Z3A00 Weeks of gestation of pregnancy not specified: Secondary | ICD-10-CM | POA: Diagnosis not present

## 2023-12-08 DIAGNOSIS — O99214 Obesity complicating childbirth: Secondary | ICD-10-CM | POA: Diagnosis not present

## 2023-12-08 DIAGNOSIS — Z412 Encounter for routine and ritual male circumcision: Secondary | ICD-10-CM | POA: Diagnosis not present

## 2023-12-08 LAB — POCT I-STAT EG7
Acid-base deficit: 3 mmol/L — ABNORMAL HIGH (ref 0.0–2.0)
Bicarbonate: 21.9 mmol/L (ref 20.0–28.0)
Calcium, Ion: 1.22 mmol/L (ref 1.15–1.40)
HCT: 28 % — ABNORMAL LOW (ref 36.0–46.0)
Hemoglobin: 9.5 g/dL — ABNORMAL LOW (ref 12.0–15.0)
O2 Saturation: 33 %
Potassium: 4.9 mmol/L (ref 3.5–5.1)
Sodium: 136 mmol/L (ref 135–145)
TCO2: 23 mmol/L (ref 22–32)
pCO2, Ven: 37.8 mm[Hg] — ABNORMAL LOW (ref 44–60)
pH, Ven: 7.372 (ref 7.25–7.43)
pO2, Ven: 21 mm[Hg] — CL (ref 32–45)

## 2023-12-08 LAB — CBC
HCT: 30.8 % — ABNORMAL LOW (ref 36.0–46.0)
Hemoglobin: 10.5 g/dL — ABNORMAL LOW (ref 12.0–15.0)
MCH: 30.1 pg (ref 26.0–34.0)
MCHC: 34.1 g/dL (ref 30.0–36.0)
MCV: 88.3 fL (ref 80.0–100.0)
Platelets: 202 10*3/uL (ref 150–400)
RBC: 3.49 MIL/uL — ABNORMAL LOW (ref 3.87–5.11)
RDW: 15.2 % (ref 11.5–15.5)
WBC: 9 10*3/uL (ref 4.0–10.5)
nRBC: 0 % (ref 0.0–0.2)

## 2023-12-08 LAB — RPR: RPR Ser Ql: NONREACTIVE

## 2023-12-08 LAB — TYPE AND SCREEN
ABO/RH(D): O POS
Antibody Screen: NEGATIVE

## 2023-12-08 LAB — GLUCOSE, CAPILLARY: Glucose-Capillary: 84 mg/dL (ref 70–99)

## 2023-12-08 SURGERY — Surgical Case
Anesthesia: Spinal

## 2023-12-08 MED ORDER — DIPHENHYDRAMINE HCL 25 MG PO CAPS: 25.0000 mg | ORAL_CAPSULE | ORAL | Status: DC | PRN

## 2023-12-08 MED ORDER — OXYCODONE HCL 5 MG PO TABS: 5.0000 mg | ORAL_TABLET | Freq: Once | ORAL | Status: DC | PRN

## 2023-12-08 MED ORDER — TRANEXAMIC ACID-NACL 1000-0.7 MG/100ML-% IV SOLN: 1000.0000 mg | Freq: Once | INTRAVENOUS | Status: DC

## 2023-12-08 MED ORDER — TRANEXAMIC ACID-NACL 1000-0.7 MG/100ML-% IV SOLN
INTRAVENOUS | Status: AC
Start: 2023-12-08 — End: ?
  Filled 2023-12-08: qty 100

## 2023-12-08 MED ORDER — PHENYLEPHRINE HCL (PRESSORS) 10 MG/ML IV SOLN
INTRAVENOUS | Status: DC | PRN
  Administered 2023-12-08 (×2): 80 ug via INTRAVENOUS

## 2023-12-08 MED ORDER — IBUPROFEN 600 MG PO TABS
600.0000 mg | ORAL_TABLET | Freq: Four times a day (QID) | ORAL | Status: DC
  Administered 2023-12-09 – 2023-12-11 (×7): 600 mg via ORAL
  Filled 2023-12-08 (×7): qty 1

## 2023-12-08 MED ORDER — PRENATAL MULTIVITAMIN CH
1.0000 | ORAL_TABLET | Freq: Every day | ORAL | Status: DC
  Administered 2023-12-09 – 2023-12-10 (×2): 1 via ORAL
  Filled 2023-12-08 (×2): qty 1

## 2023-12-08 MED ORDER — FLEET ENEMA RE ENEM: 1.0000 | ENEMA | RECTAL | Status: DC | PRN

## 2023-12-08 MED ORDER — FENTANYL CITRATE (PF) 100 MCG/2ML IJ SOLN
INTRAMUSCULAR | Status: AC
  Filled 2023-12-08: qty 2

## 2023-12-08 MED ORDER — MAGNESIUM HYDROXIDE 400 MG/5ML PO SUSP: 30.0000 mL | ORAL | Status: DC | PRN

## 2023-12-08 MED ORDER — OXYTOCIN-SODIUM CHLORIDE 30-0.9 UT/500ML-% IV SOLN
1.0000 m[IU]/min | INTRAVENOUS | Status: DC
Start: 2023-12-08 — End: 2023-12-08

## 2023-12-08 MED ORDER — WITCH HAZEL-GLYCERIN EX PADS
1.0000 | MEDICATED_PAD | CUTANEOUS | Status: DC | PRN
  Administered 2023-12-10: 1 via TOPICAL

## 2023-12-08 MED ORDER — HYDROMORPHONE HCL 1 MG/ML IJ SOLN
0.2500 mg | INTRAMUSCULAR | Status: DC | PRN
  Administered 2023-12-08: 0.5 mg via INTRAVENOUS
  Administered 2023-12-08: 0.25 mg via INTRAVENOUS

## 2023-12-08 MED ORDER — HYDROMORPHONE HCL 1 MG/ML IJ SOLN
INTRAMUSCULAR | Status: AC
  Filled 2023-12-08: qty 0.5

## 2023-12-08 MED ORDER — SIMETHICONE 80 MG PO CHEW: 80.0000 mg | CHEWABLE_TABLET | ORAL | Status: DC | PRN

## 2023-12-08 MED ORDER — LACTATED RINGERS IV SOLN: INTRAVENOUS | Status: DC

## 2023-12-08 MED ORDER — LACTATED RINGERS IV SOLN: 500.0000 mL | INTRAVENOUS | Status: DC | PRN

## 2023-12-08 MED ORDER — FENTANYL CITRATE (PF) 100 MCG/2ML IJ SOLN: 100.0000 ug | INTRAMUSCULAR | Status: DC | PRN

## 2023-12-08 MED ORDER — ACETAMINOPHEN 325 MG PO TABS: 650.0000 mg | ORAL_TABLET | ORAL | Status: DC | PRN

## 2023-12-08 MED ORDER — KETOROLAC TROMETHAMINE 30 MG/ML IJ SOLN
30.0000 mg | Freq: Four times a day (QID) | INTRAMUSCULAR | Status: AC
  Administered 2023-12-08 – 2023-12-09 (×3): 30 mg via INTRAVENOUS
  Filled 2023-12-08 (×4): qty 1

## 2023-12-08 MED ORDER — DEXTROSE IN LACTATED RINGERS 5 % IV SOLN: INTRAVENOUS | Status: AC

## 2023-12-08 MED ORDER — MENTHOL 3 MG MT LOZG: 1.0000 | LOZENGE | OROMUCOSAL | Status: DC | PRN

## 2023-12-08 MED ORDER — NALOXONE HCL 4 MG/10ML IJ SOLN: 1.0000 ug/kg/h | INTRAVENOUS | Status: DC | PRN

## 2023-12-08 MED ORDER — OXYTOCIN BOLUS FROM INFUSION: 333.0000 mL | Freq: Once | INTRAVENOUS | Status: DC

## 2023-12-08 MED ORDER — LIDOCAINE HCL (PF) 1 % IJ SOLN: 30.0000 mL | INTRAMUSCULAR | Status: DC | PRN

## 2023-12-08 MED ORDER — KETOROLAC TROMETHAMINE 30 MG/ML IJ SOLN: 30.0000 mg | Freq: Four times a day (QID) | INTRAMUSCULAR | Status: AC | PRN

## 2023-12-08 MED ORDER — DIBUCAINE (PERIANAL) 1 % EX OINT
1.0000 | TOPICAL_OINTMENT | CUTANEOUS | Status: DC | PRN
  Administered 2023-12-10: 1 via RECTAL
  Filled 2023-12-08: qty 28

## 2023-12-08 MED ORDER — NALOXONE HCL 0.4 MG/ML IJ SOLN: 0.4000 mg | INTRAMUSCULAR | Status: DC | PRN

## 2023-12-08 MED ORDER — ZOLPIDEM TARTRATE 5 MG PO TABS: 5.0000 mg | ORAL_TABLET | Freq: Every evening | ORAL | Status: DC | PRN

## 2023-12-08 MED ORDER — DIPHENHYDRAMINE HCL 50 MG/ML IJ SOLN: 12.5000 mg | INTRAMUSCULAR | Status: DC | PRN

## 2023-12-08 MED ORDER — KETOROLAC TROMETHAMINE 30 MG/ML IJ SOLN: 30.0000 mg | Freq: Once | INTRAMUSCULAR | Status: DC | PRN

## 2023-12-08 MED ORDER — SCOPOLAMINE 1 MG/3DAYS TD PT72: 1.0000 | MEDICATED_PATCH | Freq: Once | TRANSDERMAL | Status: DC

## 2023-12-08 MED ORDER — OXYTOCIN-SODIUM CHLORIDE 30-0.9 UT/500ML-% IV SOLN: 2.5000 [IU]/h | INTRAVENOUS | Status: DC

## 2023-12-08 MED ORDER — DEXAMETHASONE SODIUM PHOSPHATE 10 MG/ML IJ SOLN
INTRAMUSCULAR | Status: DC | PRN
  Administered 2023-12-08: 10 mg via INTRAVENOUS

## 2023-12-08 MED ORDER — MEPERIDINE HCL 25 MG/ML IJ SOLN: 6.2500 mg | INTRAMUSCULAR | Status: DC | PRN

## 2023-12-08 MED ORDER — OXYCODONE HCL 5 MG/5ML PO SOLN: 5.0000 mg | Freq: Once | ORAL | Status: DC | PRN

## 2023-12-08 MED ORDER — SOD CITRATE-CITRIC ACID 500-334 MG/5ML PO SOLN: 30.0000 mL | ORAL | Status: DC

## 2023-12-08 MED ORDER — SODIUM CHLORIDE 0.9% FLUSH: 3.0000 mL | INTRAVENOUS | Status: DC | PRN

## 2023-12-08 MED ORDER — ONDANSETRON HCL 4 MG/2ML IJ SOLN: 4.0000 mg | Freq: Four times a day (QID) | INTRAMUSCULAR | Status: DC | PRN

## 2023-12-08 MED ORDER — DIPHENHYDRAMINE HCL 25 MG PO CAPS: 25.0000 mg | ORAL_CAPSULE | Freq: Four times a day (QID) | ORAL | Status: DC | PRN

## 2023-12-08 MED ORDER — TRANEXAMIC ACID-NACL 1000-0.7 MG/100ML-% IV SOLN
INTRAVENOUS | Status: DC | PRN
  Administered 2023-12-08: 1000 mg via INTRAVENOUS

## 2023-12-08 MED ORDER — CEFAZOLIN SODIUM-DEXTROSE 2-4 GM/100ML-% IV SOLN
INTRAVENOUS | Status: AC
  Filled 2023-12-08: qty 100

## 2023-12-08 MED ORDER — ONDANSETRON HCL 4 MG/2ML IJ SOLN: 4.0000 mg | Freq: Once | INTRAMUSCULAR | Status: DC | PRN

## 2023-12-08 MED ORDER — TERBUTALINE SULFATE 1 MG/ML IJ SOLN: 0.2500 mg | Freq: Once | INTRAMUSCULAR | Status: DC | PRN

## 2023-12-08 MED ORDER — STERILE WATER FOR IRRIGATION IR SOLN
Status: DC | PRN
  Administered 2023-12-08: 1000 mL

## 2023-12-08 MED ORDER — BUPIVACAINE IN DEXTROSE 0.75-8.25 % IT SOLN
INTRATHECAL | Status: DC | PRN
  Administered 2023-12-08: 1.6 mL via INTRATHECAL

## 2023-12-08 MED ORDER — LACTATED RINGERS IV SOLN
INTRAVENOUS | Status: DC
Start: 2023-12-08 — End: 2023-12-08

## 2023-12-08 MED ORDER — ACETAMINOPHEN 10 MG/ML IV SOLN
INTRAVENOUS | Status: DC | PRN
  Administered 2023-12-08: 1000 mg via INTRAVENOUS

## 2023-12-08 MED ORDER — ACETAMINOPHEN 500 MG PO TABS: 1000.0000 mg | ORAL_TABLET | Freq: Four times a day (QID) | ORAL | Status: DC

## 2023-12-08 MED ORDER — SOD CITRATE-CITRIC ACID 500-334 MG/5ML PO SOLN
30.0000 mL | ORAL | Status: DC | PRN
  Administered 2023-12-08: 30 mL via ORAL

## 2023-12-08 MED ORDER — OXYCODONE HCL 5 MG PO TABS
5.0000 mg | ORAL_TABLET | ORAL | Status: DC | PRN
  Administered 2023-12-10: 5 mg via ORAL
  Administered 2023-12-10 – 2023-12-11 (×2): 10 mg via ORAL
  Filled 2023-12-08: qty 2
  Filled 2023-12-08: qty 1
  Filled 2023-12-08: qty 2

## 2023-12-08 MED ORDER — SODIUM CHLORIDE 0.9 % IR SOLN
Status: DC | PRN
  Administered 2023-12-08: 1000 mL

## 2023-12-08 MED ORDER — ONDANSETRON HCL 4 MG/2ML IJ SOLN
INTRAMUSCULAR | Status: DC | PRN
  Administered 2023-12-08: 4 mg via INTRAVENOUS

## 2023-12-08 MED ORDER — AMISULPRIDE (ANTIEMETIC) 5 MG/2ML IV SOLN: 10.0000 mg | Freq: Once | INTRAVENOUS | Status: DC | PRN

## 2023-12-08 MED ORDER — ACETAMINOPHEN 10 MG/ML IV SOLN
INTRAVENOUS | Status: AC
  Filled 2023-12-08: qty 100

## 2023-12-08 MED ORDER — FENTANYL CITRATE (PF) 100 MCG/2ML IJ SOLN
INTRAMUSCULAR | Status: DC | PRN
  Administered 2023-12-08: 15 ug via INTRATHECAL

## 2023-12-08 MED ORDER — ONDANSETRON HCL 4 MG/2ML IJ SOLN: 4.0000 mg | Freq: Three times a day (TID) | INTRAMUSCULAR | Status: DC | PRN

## 2023-12-08 MED ORDER — MORPHINE SULFATE (PF) 0.5 MG/ML IJ SOLN
INTRAMUSCULAR | Status: DC | PRN
  Administered 2023-12-08: .15 mg via INTRATHECAL

## 2023-12-08 MED ORDER — OXYTOCIN-SODIUM CHLORIDE 30-0.9 UT/500ML-% IV SOLN
2.5000 [IU]/h | INTRAVENOUS | Status: AC
  Administered 2023-12-08: 2.5 [IU]/h via INTRAVENOUS
  Filled 2023-12-08: qty 500

## 2023-12-08 MED ORDER — ACETAMINOPHEN 500 MG PO TABS
1000.0000 mg | ORAL_TABLET | Freq: Four times a day (QID) | ORAL | Status: AC
  Administered 2023-12-08 – 2023-12-09 (×4): 1000 mg via ORAL
  Filled 2023-12-08 (×4): qty 2

## 2023-12-08 MED ORDER — CEFAZOLIN SODIUM-DEXTROSE 2-4 GM/100ML-% IV SOLN
2.0000 g | INTRAVENOUS | Status: AC
  Administered 2023-12-08 (×2): 2 g via INTRAVENOUS

## 2023-12-08 MED ORDER — OXYTOCIN-SODIUM CHLORIDE 30-0.9 UT/500ML-% IV SOLN
INTRAVENOUS | Status: AC
  Filled 2023-12-08: qty 500

## 2023-12-08 MED ORDER — OXYTOCIN-SODIUM CHLORIDE 30-0.9 UT/500ML-% IV SOLN
INTRAVENOUS | Status: DC | PRN
  Administered 2023-12-08: 300 mL via INTRAVENOUS

## 2023-12-08 MED ORDER — SOD CITRATE-CITRIC ACID 500-334 MG/5ML PO SOLN
ORAL | Status: AC
Start: 2023-12-08 — End: ?
  Filled 2023-12-08: qty 30

## 2023-12-08 MED ORDER — COCONUT OIL OIL: 1.0000 | TOPICAL_OIL | Status: DC | PRN

## 2023-12-08 MED ORDER — PHENYLEPHRINE HCL-NACL 20-0.9 MG/250ML-% IV SOLN
INTRAVENOUS | Status: DC | PRN
  Administered 2023-12-08: 60 ug/min via INTRAVENOUS

## 2023-12-08 MED ORDER — TERBUTALINE SULFATE 1 MG/ML IJ SOLN
0.2500 mg | Freq: Once | INTRAMUSCULAR | Status: AC
Start: 2023-12-08 — End: 2023-12-08
  Administered 2023-12-08: 0.25 mg via SUBCUTANEOUS
  Filled 2023-12-08: qty 1

## 2023-12-08 MED ORDER — MORPHINE SULFATE (PF) 0.5 MG/ML IJ SOLN
INTRAMUSCULAR | Status: AC
  Filled 2023-12-08: qty 10

## 2023-12-08 SURGICAL SUPPLY — 35 items
BENZOIN TINCTURE PRP APPL 2/3 (GAUZE/BANDAGES/DRESSINGS) IMPLANT
CHLORAPREP W/TINT 26 (MISCELLANEOUS) ×2 IMPLANT
CLAMP UMBILICAL CORD (MISCELLANEOUS) ×1 IMPLANT
CLOTH BEACON ORANGE TIMEOUT ST (SAFETY) ×1 IMPLANT
DERMABOND ADVANCED .7 DNX12 (GAUZE/BANDAGES/DRESSINGS) IMPLANT
DRAPE C SECTION CLR SCREEN (DRAPES) ×1 IMPLANT
DRSG OPSITE POSTOP 4X10 (GAUZE/BANDAGES/DRESSINGS) ×1 IMPLANT
ELECT REM PT RETURN 9FT ADLT (ELECTROSURGICAL) ×1 IMPLANT
ELECTRODE REM PT RTRN 9FT ADLT (ELECTROSURGICAL) ×1 IMPLANT
EXTRACTOR VACUUM KIWI (MISCELLANEOUS) ×1 IMPLANT
GAUZE SPONGE 4X4 12PLY STRL LF (GAUZE/BANDAGES/DRESSINGS) IMPLANT
GLOVE BIOGEL PI IND STRL 7.0 (GLOVE) ×2 IMPLANT
GLOVE SURG SS PI 6.5 STRL IVOR (GLOVE) ×1 IMPLANT
GOWN STRL REUS W/TWL LRG LVL3 (GOWN DISPOSABLE) ×2 IMPLANT
KIT ABG SYR 3ML LUER SLIP (SYRINGE) IMPLANT
NDL HYPO 25X5/8 SAFETYGLIDE (NEEDLE) IMPLANT
NDL KEITH (NEEDLE) ×1 IMPLANT
NEEDLE HYPO 25X5/8 SAFETYGLIDE (NEEDLE) IMPLANT
NEEDLE KEITH (NEEDLE) ×1 IMPLANT
NS IRRIG 1000ML POUR BTL (IV SOLUTION) ×1 IMPLANT
PACK C SECTION WH (CUSTOM PROCEDURE TRAY) ×1 IMPLANT
PAD OB MATERNITY 4.3X12.25 (PERSONAL CARE ITEMS) ×1 IMPLANT
RTRCTR C-SECT PINK 25CM LRG (MISCELLANEOUS) ×1 IMPLANT
SPONGE T-LAP 18X18 ~~LOC~~+RFID (SPONGE) IMPLANT
SUT CHROMIC 1 CTX 36 (SUTURE) IMPLANT
SUT CHROMIC 2 0 CT 1 (SUTURE) ×1 IMPLANT
SUT PDS AB 0 CTX 60 (SUTURE) IMPLANT
SUT PLAIN 1 NONE 54 (SUTURE) IMPLANT
SUT PLAIN 2 0 XLH (SUTURE) IMPLANT
SUT PLAIN ABS 2-0 CT1 27XMFL (SUTURE) IMPLANT
SUT VIC AB 0 CTX36XBRD ANBCTRL (SUTURE) ×1 IMPLANT
SUT VIC AB 1 CTX36XBRD ANBCTRL (SUTURE) ×2 IMPLANT
TOWEL OR 17X24 6PK STRL BLUE (TOWEL DISPOSABLE) ×1 IMPLANT
TRAY FOLEY W/BAG SLVR 14FR LF (SET/KITS/TRAYS/PACK) ×1 IMPLANT
WATER STERILE IRR 1000ML POUR (IV SOLUTION) ×1 IMPLANT

## 2023-12-08 NOTE — Progress Notes (Signed)
Patient ID: Alice Nash, female   DOB: 1986-04-24, 38 y.o.   MRN: [id number] Asked to verify fetal position by RN  Pt informed that the ultrasound is considered a limited OB ultrasound and is not intended to be a complete ultrasound exam.  Patient also informed that the ultrasound is not being completed with the intent of assessing for fetal or placental anomalies or any pelvic abnormalities.  Explained that the purpose of today's ultrasound is to assess for presentation.  Patient acknowledges the purpose of the exam and the limitations of the study.    Fetus is in the longitudinal lie Breech presentation Unsure if complete or frank.  RN will notify MD

## 2023-12-08 NOTE — Anesthesia Procedure Notes (Signed)
Spinal  Patient location during procedure: OR Start time: 12/08/2023 11:21 AM End time: 12/08/2023 11:28 AM Reason for block: surgical anesthesia Staffing Performed: anesthesiologist and other anesthesia staff  Anesthesiologist: Lannie Fields, DO Performed by: Lannie Fields, DO Authorized by: Lannie Fields, DO   Preanesthetic Checklist Completed: patient identified, IV checked, risks and benefits discussed, surgical consent, monitors and equipment checked, pre-op evaluation and timeout performed Spinal Block Patient position: sitting Prep: DuraPrep and site prepped and draped Patient monitoring: cardiac monitor, continuous pulse ox and blood pressure Approach: midline Location: L3-4 Injection technique: single-shot Needle Needle type: Pencan  Needle gauge: 24 G Needle length: 9 cm Assessment Sensory level: T6 Events: CSF return Additional Notes Functioning IV was confirmed and monitors were applied. Sterile prep and drape, including hand hygiene and sterile gloves were used. The patient was positioned and the spine was prepped. The skin was anesthetized with lidocaine.  Free flow of clear CSF was obtained prior to injecting local anesthetic into the CSF.  The spinal needle aspirated freely following injection.  The needle was carefully withdrawn.  The patient tolerated the procedure well.   Performed by srna under direct supervision

## 2023-12-08 NOTE — Anesthesia Preprocedure Evaluation (Signed)
Anesthesia Evaluation  Patient identified by MRN, date of birth, ID band Patient awake    Reviewed: Allergy & Precautions, H&P , NPO status , Patient's Chart, lab work & pertinent test results, reviewed documented beta blocker date and time   Airway Mallampati: III  TM Distance: >3 FB Neck ROM: Full    Dental no notable dental hx. (+) Teeth Intact, Dental Advisory Given   Pulmonary neg pulmonary ROS   Pulmonary exam normal breath sounds clear to auscultation       Cardiovascular hypertension (143/76 preop), Pt. on medications and Pt. on home beta blockers Normal cardiovascular exam Rhythm:Regular Rate:Normal     Neuro/Psych negative neurological ROS  negative psych ROS   GI/Hepatic negative GI ROS, Neg liver ROS,,,  Endo/Other  Obesity BMI 36  Renal/GU negative Renal ROS  negative genitourinary   Musculoskeletal negative musculoskeletal ROS (+)    Abdominal  (+) + obese  Peds negative pediatric ROS (+)  Hematology  (+) Blood dyscrasia, anemia Hb 10.5, plt 202   Anesthesia Other Findings   Reproductive/Obstetrics (+) Pregnancy 1 prior section w/ spinal, no issues                             Anesthesia Physical Anesthesia Plan  ASA: 3  Anesthesia Plan: Spinal   Post-op Pain Management: Regional block, Toradol IV (intra-op)* and Ofirmev IV (intra-op)*   Induction:   PONV Risk Score and Plan: 3 and Ondansetron, Dexamethasone and Treatment may vary due to age or medical condition  Airway Management Planned: Natural Airway and Nasal Cannula  Additional Equipment: None  Intra-op Plan:   Post-operative Plan:   Informed Consent: I have reviewed the patients History and Physical, chart, labs and discussed the procedure including the risks, benefits and alternatives for the proposed anesthesia with the patient or authorized representative who has indicated his/her understanding and  acceptance.       Plan Discussed with: CRNA  Anesthesia Plan Comments:        Anesthesia Quick Evaluation

## 2023-12-08 NOTE — Transfer of Care (Signed)
Immediate Anesthesia Transfer of Care Note  Patient: Alice Nash  Procedure(s) Performed: CESAREAN SECTION  Patient Location: PACU  Anesthesia Type:Spinal  Level of Consciousness: awake, alert , and oriented  Airway & Oxygen Therapy: Patient Spontanous Breathing  Post-op Assessment: Report given to RN and Post -op Vital signs reviewed and stable  Post vital signs: Reviewed and stable  Last Vitals:  Vitals Value Taken Time  BP 123/71 12/08/23 1311  Temp    Pulse 75 12/08/23 1313  Resp 28 12/08/23 1313  SpO2 98 % 12/08/23 1313  Vitals shown include unfiled device data.  Last Pain:  Vitals:   12/08/23 0931  TempSrc: Oral  PainSc:          Complications: No notable events documented.

## 2023-12-08 NOTE — H&P (View-Only) (Signed)
Alice Nash is a 38 y.o. G2P1000 at [redacted]w[redacted]d admitted for induction of labor due to Chronic hypertension.  Subjective:  Patient is comfortable, denies any contractions.   Objective: BP (!) 143/76 (BP Location: Left Arm)   Pulse 84   Temp 98.1 F (36.7 C) (Oral)   Resp 16   Ht 5\' 4"  (1.626 m)   Wt 96.2 kg   SpO2 98%   BMI 36.39 kg/m  No intake/output data recorded. No intake/output data recorded.  FHT:  FHR: 130 bpm, variability: moderate,  accelerations:  Present,  decelerations:  Absent UC:   none SVE:   Dilation: Fingertip Effacement (%): 50 Station: -3 Exam by:: Dr. Sallye Nash  Labs: Lab Results  Component Value Date   WBC 9.0 12/08/2023   HGB 10.5 (L) 12/08/2023   HCT 30.8 (L) 12/08/2023   MCV 88.3 12/08/2023   PLT 202 12/08/2023   Bedside ultrasound: Transverse presentation with head to maternal left and back to maternal right, anterior placenta, 3 fluid pockets each with over 2 cm of fluid.   Assessment / Plan: 38 y/o G2P1001 at [redacted] weeks EGA, IOL for chronic hypertension, with transverse fetal presentation  Labor:  Management options discussed. SHe would like external cephalic version and trial of labor after cesarean section.  We discussed risks, benefits and alternatives of the procedure including risks of fetal distress, uterine rupture, rupture of membranes.  She expressed understanding of all this and desired to proceed with external cephalic version (ECV). We discussed if ECV is not successful then would proceed with repeat cesarean section. Will give terbutaline.  Preeclampsia:   With gestational HTN, with mild range elevated blood pressures.  Fetal Wellbeing:  Category I Pain Control:  Labor support without medications, Epidural, IV pain meds, and Nitrous Oxide as desired.  I/D:   GBS negative.  Anticipated MOD:   VBAC.   Alice Sours, MD 12/08/2023, 10:20 AM

## 2023-12-08 NOTE — Op Note (Signed)
Patient: Alice Nash  DOB: August 12, 1986 MRN: [id number]   DATE OF SURGERY:  12/08/2023.   PREOP DIAGNOSIS:  1. [redacted] week EGA IUP. 2.  History of 1 prior cesarean section and is for a repeat cesarean delivery.  3. Malpresentation of fetus with transverse fetal lie.  4. Failed external cephalic version.   POSTOP DIAGNOSIS: Same as above.   PROCEDURE:  1. Repeat low uterine segment transverse cesarean section via Pfannenstiel incision.   SURGEON: Dr.  Hoover Browns.  ASSISTANT:  Dr. Merian Capron.   SURGEON ATTESTATION: I was present and scrubbed for the entire case.  An experienced assistant was required given the standard of surgical care given the complexity of the case.  This assistant was needed for exposure, dissection, suctioning, retraction, instrument exchange,  assisting with delivery with administration of fundal pressure, and for overall help during the surgery.    ANESTHESIA: Spinal, Dr. Lacy Duverney.  COMPLICATIONS: None.   FINDINGS: Viable female infant in transverse presentation, weight 6lbs 9.8 oz, Apgar scores of 9 and 9.  Uterus with large sinuses on the left side, uterus with 4 cm subserosal anterior fibroid.  Normal left and right ovaries, normal bilateral fallopian tubes.   EBL:   1700 cc.  IV FLUID:  1800 cc LR.   URINE OUTPUT: 200 cc clear urine.  INDICATIONS:  38 y/o P1 with a history of one prior cesarean section who presented for induction of labor at [redacted] weeks EGA for chronic HTN. She was found to have transverse fetal presentation. An external cephalic version was attempted but was unsuccessful at which point she desired to proceed with a repeat cesarean section .    PROCEDURE:   Informed consent was obtained from the patient to undergo the procedure. She was taken to the operating room where her anesthesia was found to be adequate.  She was prepped abdominally with chloraprep and vaginally with betadine and draped in the usual sterile fashion and a  Foley catheter was placed. She received IV ancef and Tranexamic acid preoperatively.  A Pfannenstiel incision was made with the scalpel above the prior incision and the incision extended through the subcutaneous layer and also the fascia with the bovie. Small perforators in the subcutaneous layer were contained with the Bovie. The fascia was separated from the rectus muscles bilaterally using Mayo scissors. Kochers were placed inferiorly and then superiorly to allow further separation of fascia from the rectus muscles.  The peritoneal cavity was entered with Metzenbaum scissors and the rectus muscles further separated lengthwise.  The Alexis retractor was placed in. The uterovesical fold was dissected down with Metzenbaum scissors.  The uterus was incised with a scalpel above the fold and the incision extended bluntly bilaterally with fingers and with bandage scissors.  Membranes were ruptured and moderate clear amniotic fluid was noted.  Fetal feet were encountered and the baby was delivered as per standard maneuvers atraumatically: The feet were delivered, baby was then rotated so that fetal sacrum was anterior.  Gentle traction was applied on the hips to deliver the trunk.  Baby was rotated to its left side and right arm was delivered.  Baby was then rotated to its right side and the left arm was delivered.  Maxillary pressure was applied and abdominal pressure applied to deliver the head.  She delivered a viable female infant, apgar scores 9, 9.  The cord was clamped and cut after 1 minute. Cord blood was collected.    The uterus was not exteriorized.  The  edges of the uterus was grasped with Allis clamps. The placenta was delivered with gentle traction on the umbilical cord. The uterus was cleared of clots and debris with a lap.  The uterine incision was closed with #1 Vicryl in a running locked stitch. An imbricating layer of the same stitch was placed over the initial closure.  A small area that bled on the  right side was contained with figure of 8 stitch.  Irrigation was applied and suctioned out. Excellent hemostasis was noted over the incision.  The peritoneum were then reapproximated using 2-0 chromic suture.  Fascia was closed using 0 looped PDS in a running stitch. The subcutaneous layer was irrigated and suctioned out. Small perforators were contained with the bovie.  The subcutaneous layer was closed using 1-0 plain.  The skin was closed using 4-0 vicryl on the keith needle. Benzocaine and steri strips were applied.  Honeycomb and pressure dressings were then applied. The patient was then cleaned and she was taken to the recovery room with her baby in stable conditions.   SPECIMEN: Placenta to labor and delivery, umbilical cord blood to lab.   DISPOSITION: TO PACU, STABLE.   Dr. Hoover Browns.    12/08/2023.

## 2023-12-08 NOTE — Interval H&P Note (Signed)
History and Physical Interval Note:  12/08/2023 10:29 AM  Alice Nash  has presented today for surgery, with the diagnosis of transverse presentation.  The various methods of treatment have been discussed with the patient and family. After consideration of risks, benefits and other options for treatment, the patient has consented to  External cephalic version as a surgical intervention.  The patient's history has been reviewed, patient examined, no change in status, stable for surgery.  I have reviewed the patient's chart and labs.  Questions were answered to the patient's satisfaction.     Prescilla Sours, MD.

## 2023-12-08 NOTE — Lactation Note (Signed)
This note was copied from a baby's chart. Lactation Consultation Note  Patient Name: Alice Nash Date: 12/08/2023 Age:40 hours Reason for consult: Initial assessment;Term  P2- [name] reports that infant has been nursing well so far and denies experiencing any pain or discomfort. LC reviewed the first 24 hr birthday nap and what that may look like for feedings. LC offered to demonstrate hand expression and [name] consented. LC noted that [name] has rolling colostrum that squirts across the bed when you express. LC had [name] reciprocate, but at this time she had some difficulty. [name] was willing to attempt hand expression in case [name] needs assistance. [name] was able to express a lot of colostrum with one expression. LC praised both [name] and [name]. LC also provided [name] with a manual pump with size 30 mm flanges. [name] confirms that infant has been able to open his mouth wide enough to latch. LC encouraged [name] to call for a latch assessment at some point.  LC reviewed feeding infant on cue 8-12x in 24 hrs, not allowing infant to go over 3 hrs without a feeding, CDC milk storage guidelines and LC services handout. LC encouraged [name] to call for further assistance as needed.  Maternal Data Has patient been taught Hand Expression?: Yes Does the patient have breastfeeding experience prior to this delivery?: Yes How long did the patient breastfeed?: 1 year of exclusive pumping  Feeding Mother's Current Feeding Choice: Breast Milk  Lactation Tools Discussed/Used Tools: Pump;Flanges Flange Size: 30 Breast pump type: Manual Pump Education: Setup, frequency, and cleaning;Milk Storage Reason for Pumping: [name] request Pumping frequency: 15-20 min every 3 hrs as needed  Interventions Interventions: Breast feeding basics reviewed;Hand express;Expressed milk;Hand pump;Education;LC Services brochure  Discharge Discharge Education: Warning signs for feeding baby Pump: DEBP;Personal (Medela)  Consult  Status Consult Status: Follow-up Date: 12/09/23 Follow-up type: In-patient    Dema Severin BS, IBCLC 12/08/2023, 5:03 PM

## 2023-12-08 NOTE — Procedures (Signed)
  Name: Alice Nash MRN: [id number] DOB: 16-Jan-1986 Date of procedure: 12/08/2023.   Pre- procedure diagnosis:  1. [redacted] week EGA IUP 2. Transverse fetal presentation  2. Post-op diagnosis:  1. Same as above  Procedure: External cephalic version (ECV) (Attempted).   Anesthesia: None  Surgeon: Dr. Hoover Browns  Assistant:  Dr. Merian Capron.   Surgeon Attestation: I was present and scrubbed and performed the procedure and the assistant was required to help with external cephalic maneuvers.   Complications: None.  Indication:  38 y/o G3P1011 @ [redacted] weeks EGA with transverse fetal presentation desiring an ECV.   Procedure: Informed consent was obtained after explaining the risks, benefits and alternatives of the procedure including but not limited to risks of placenta abruption, rupture of membranes and fetal distress.  Before the procedure NST was reviewed and noted to be category 1.  Patient received subcutaneous terbutaline before attempt.  Bedside ultrasound confirmed transverse presentation, fetal head in mother's left mid abdomen.  Forward somersault ECV maneuvers were performed.  Total two attempts, each about two minutes long with a break after every two minutes to check on baby's heart rate.  After first attempt fetal heart beat remained in the normal range. After second attempt there was a momentary fetal deceleration to 108 which then improved back to 135 within a few seconds.  The fetal head seemed to move close to vertex position with ECV but then would revert back to transverse presentation in mother's left mid abdomen. Patient did not desire any further attempts after the second attempt.  Patient was monitored after ECV attempt with normal fetal heart tracing noted.  She decided to proceed with a cesarean delivery.    Dr. Hoover Browns.  12/08/23.

## 2023-12-08 NOTE — H&P (Signed)
Alice Nash is a 38 y.o. female presenting for  induction of labor due to [facility].  She does feel some contractions.  No HA or blurred vision History OB History     Gravida  2   Para  1   Term  1   Preterm      AB      Living         SAB      IAB      Ectopic      Multiple      Live Births  0          Past Medical History:  Diagnosis Date   Fibroid    Past Surgical History:  Procedure Laterality Date   CESAREAN SECTION N/A 12/02/2017   Procedure: CESAREAN SECTION;  Surgeon: Silverio Lay, MD;  Location: [facility] BIRTHING SUITES;  Service: Obstetrics;  Laterality: N/A;   WISDOM TOOTH EXTRACTION     WOUND EXPLORATION N/A 12/10/2017   Procedure: WOUND EXPLORATION;  Surgeon: Geryl Rankins, MD;  Location: [facility] ORS;  Service: Gynecology;  Laterality: N/A;   Family History: family history includes Hypertension in her mother; Kidney cancer in her father. Social History:  reports that she has never smoked. She has never used smokeless tobacco. She reports that she does not drink alcohol and does not use drugs.  Review of Systems - Cardiovascular ROS: no chest pain or dyspnea on exertion Physical Examination: General appearance - alert, well appearing, and in no distress Chest - clear to auscultation, no wheezes, rales or rhonchi, symmetric air entry Heart - normal rate and regular rhythm Abdomen - soft, nontender, nondistended, no masses or organomegaly gravid Extremities - peripheral pulses normal, no pedal edema, no clubbing or cyanosis, Homan's sign negative bilaterally Exam from 1/22   Dilation: Fingertip Effacement (%): 50 Station: -2 Exam by:: [place] Turner RN Blood pressure (!) 142/83, pulse 90, temperature 98.2 F (36.8 C), temperature source Oral, resp. rate 16, height 5\' 4"  (1.626 m), weight 96.5 kg, unknown if currently breastfeeding. Exam Physical Exam  Prenatal labs: ABO, Rh: --/--/PENDING (01/29 0112) Antibody: PENDING (01/29 0112) Rubella: Immune  (07/07 0000) RPR: Nonreactive (07/07 0000)  HBsAg: Negative (07/07 0000)  HIV: Non-reactive (07/07 0000)  GBS: Negative/-- (01/07 0000)   Assessment/Plan: CHTN FOR IOL.  Well controlled with labetalol Pt for VBAC.  VBAC form signed in the office R&B reviewed in detail Will start pitocin for cervical ripening Cat 1  Toco q 5 min Epidural PRN Anticipate VBAC GBS NEG Pt received RSV vaccine and TDAP   Belvia Gotschall A Vahan Wadsworth 12/08/2023, 1:55 AM

## 2023-12-08 NOTE — Anesthesia Postprocedure Evaluation (Signed)
Anesthesia Post Note  Patient: Alice Nash  Procedure(s) Performed: CESAREAN SECTION     Patient location during evaluation: PACU Anesthesia Type: Spinal Level of consciousness: awake and alert and oriented Pain management: pain level controlled Vital Signs Assessment: post-procedure vital signs reviewed and stable Respiratory status: spontaneous breathing, nonlabored ventilation and respiratory function stable Cardiovascular status: blood pressure returned to baseline and stable Postop Assessment: no headache, no backache and spinal receding Anesthetic complications: no   No notable events documented.  Last Vitals:  Vitals:   12/08/23 1330 12/08/23 1345  BP: 112/89 116/84  Pulse: 77 77  Resp: 17 (!) 21  Temp:    SpO2: 98% 98%    Last Pain:  Vitals:   12/08/23 1345  TempSrc:   PainSc: 5     LLE Motor Response: Purposeful movement (12/08/23 1345) LLE Sensation: Tingling (12/08/23 1345) RLE Motor Response: Purposeful movement (12/08/23 1345) RLE Sensation: Tingling (12/08/23 1345)      Lannie Fields

## 2023-12-08 NOTE — Progress Notes (Signed)
Alice Nash is a 38 y.o. G2P1000 at [redacted]w[redacted]d admitted for induction of labor due to Chronic hypertension.  Subjective:  Patient is comfortable, denies any contractions.   Objective: BP (!) 143/76 (BP Location: Left Arm)   Pulse 84   Temp 98.1 F (36.7 C) (Oral)   Resp 16   Ht 5\' 4"  (1.626 m)   Wt 96.2 kg   SpO2 98%   BMI 36.39 kg/m  No intake/output data recorded. No intake/output data recorded.  FHT:  FHR: 130 bpm, variability: moderate,  accelerations:  Present,  decelerations:  Absent UC:   none SVE:   Dilation: Fingertip Effacement (%): 50 Station: -3 Exam by:: Dr. Sallye Nash  Labs: Lab Results  Component Value Date   WBC 9.0 12/08/2023   HGB 10.5 (L) 12/08/2023   HCT 30.8 (L) 12/08/2023   MCV 88.3 12/08/2023   PLT 202 12/08/2023   Bedside ultrasound: Transverse presentation with head to maternal left and back to maternal right, anterior placenta, 3 fluid pockets each with over 2 cm of fluid.   Assessment / Plan: 38 y/o G2P1001 at [redacted] weeks EGA, IOL for chronic hypertension, with transverse fetal presentation  Labor:  Management options discussed. SHe would like external cephalic version and trial of labor after cesarean section.  We discussed risks, benefits and alternatives of the procedure including risks of fetal distress, uterine rupture, rupture of membranes.  She expressed understanding of all this and desired to proceed with external cephalic version (ECV). We discussed if ECV is not successful then would proceed with repeat cesarean section. Will give terbutaline.  Preeclampsia:   With gestational HTN, with mild range elevated blood pressures.  Fetal Wellbeing:  Category I Pain Control:  Labor support without medications, Epidural, IV pain meds, and Nitrous Oxide as desired.  I/D:   GBS negative.  Anticipated MOD:   VBAC.   Alice Sours, MD 12/08/2023, 10:20 AM

## 2023-12-08 NOTE — Lactation Note (Signed)
This note was copied from a baby's chart. Lactation Consultation Note  Patient Name: Alice Nash [id number] Date: 12/08/2023 Age:40 hours Reason for consult: Follow-up assessment;Mother's request;Term;Breastfeeding assistance  P2 [name] requested for LC to watch infant latch to ensure infant is latching deep enough due to [name]'s nipple size. When [facility] was able to come into the room, infant had already been nursing for 15 minutes per [name]. [name] had infant on the left breast in the cross cradle hold. Infant had [name]'s entire nipple in his mouth and was as deep as he could be. LC praised infant and [name]. At this point, infant was no longer interested in nursing, so he was placed STS with [name]. LC reassured [name] that infant latched very well at this time. LC encouraged [name] to call for further assistance as needed.  Maternal Data Has patient been taught Hand Expression?: Yes Does the patient have breastfeeding experience prior to this delivery?: Yes How long did the patient breastfeed?: 1 year of exclusive pumping  Feeding Mother's Current Feeding Choice: Breast Milk  LATCH Score Latch: Repeated attempts needed to sustain latch, nipple held in mouth throughout feeding, stimulation needed to elicit sucking reflex. (Per [name])  Audible Swallowing: A few with stimulation  Type of Nipple: Everted at rest and after stimulation  Comfort (Breast/Nipple): Soft / non-tender  Hold (Positioning): No assistance needed to correctly position infant at breast.  LATCH Score: 8   Lactation Tools Discussed/Used Tools: Pump;Flanges Flange Size: 30 Breast pump type: Double-Electric Breast Pump;Manual Pump Education: Setup, frequency, and cleaning;Milk Storage Reason for Pumping: [name] request Pumping frequency: 15-20 min as needed  Interventions Interventions: Breast feeding basics reviewed;Assisted with latch;Breast compression;Adjust position;Support pillows;Position options;Education;LC Services  brochure  Discharge Discharge Education: Warning signs for feeding baby Pump: DEBP;Personal  Consult Status Consult Status: Follow-up Date: 12/09/23 Follow-up type: In-patient    Dema Severin BS, IBCLC 12/08/2023, 8:53 PM

## 2023-12-09 ENCOUNTER — Encounter (HOSPITAL_COMMUNITY): Payer: Self-pay | Admitting: Obstetrics & Gynecology

## 2023-12-09 LAB — CBC
HCT: 23.8 % — ABNORMAL LOW (ref 36.0–46.0)
Hemoglobin: 8.2 g/dL — ABNORMAL LOW (ref 12.0–15.0)
MCH: 30.3 pg (ref 26.0–34.0)
MCHC: 34.5 g/dL (ref 30.0–36.0)
MCV: 87.8 fL (ref 80.0–100.0)
Platelets: 179 10*3/uL (ref 150–400)
RBC: 2.71 MIL/uL — ABNORMAL LOW (ref 3.87–5.11)
RDW: 15.2 % (ref 11.5–15.5)
WBC: 11.6 10*3/uL — ABNORMAL HIGH (ref 4.0–10.5)
nRBC: 0 % (ref 0.0–0.2)

## 2023-12-09 MED ORDER — SENNOSIDES-DOCUSATE SODIUM 8.6-50 MG PO TABS
2.0000 | ORAL_TABLET | Freq: Every day | ORAL | Status: DC
  Administered 2023-12-09 – 2023-12-10 (×2): 2 via ORAL
  Filled 2023-12-09 (×2): qty 2

## 2023-12-09 NOTE — Lactation Note (Signed)
This note was copied from a baby's chart. Lactation Consultation Note  Patient Name: Alice Nash Date: 12/09/2023 Age:40 hours Reason for consult: Follow-up assessment;Term;Infant weight loss (3 % weight loss) Per mom last fed at 1115 and was sleepy at the breast so I fed him 20 ml of formula.  LC updated the doc flow sheets and recommended calling for the next feeding for assessment.    Maternal Data Has patient been taught Hand Expression?: Yes  Feeding Mother's Current Feeding Choice: Breast Milk and Formula Nipple Type: Slow - flow  LATCH Score - 8 ( previous LC   Lactation Tools Discussed/Used Tools: Pump;Flanges Flange Size: 30;Other (comment) (measure from previous LC) Breast pump type: Manual Pump Education: Milk Storage;Setup, frequency, and cleaning  Interventions Interventions: Breast feeding basics reviewed;Hand pump;Education;LC Services brochure;CDC Guidelines for Breast Pump Cleaning  Discharge Pump: Personal;DEBP  Consult Status Consult Status: Follow-up Date: 12/09/23 Follow-up type: In-patient    [name] 12/09/2023, 12:33 PM

## 2023-12-09 NOTE — Progress Notes (Addendum)
Subjective: Postpartum Day 1: Cesarean Delivery Patient reports tolerating PO, + flatus, and no problems voiding.    Objective: Vital signs in last 24 hours: Temp:  [97.7 F (36.5 C)-98.5 F (36.9 C)] 98.5 F (36.9 C) (01/30 0728) Pulse Rate:  [64-82] 64 (01/30 0728) Resp:  [17-30] 20 (01/30 0728) BP: (112-149)/(49-89) 113/70 (01/30 0728) SpO2:  [98 %-100 %] 100 % (01/30 0728)  Physical Exam:  General: alert, cooperative, and no distress Lochia: appropriate Uterine Fundus: FF, NT Incision: pressure dressing removed, honeycomb stained along lower edge DVT Evaluation: No evidence of DVT seen on physical exam.   Recent Labs    12/08/23 1243 12/09/23 0455  HGB 9.5* 8.2*  HCT 28.0* 23.8*    Assessment/Plan: Status post Cesarean section. Doing well postoperatively.  Continue current care. SCDs for DVT prophylaxis (on bed) when laying for extended periods of time Plan circumcision on baby today Change honeycomb dressing d/t staining  Purcell Nails, MD 12/09/2023, 12:00 PM

## 2023-12-09 NOTE — Lactation Note (Signed)
This note was copied from a baby's chart. Lactation Consultation Note  Patient Name: Alice Nash [id number] Date: 12/09/2023 Age:40 hours Reason for consult: Follow-up assessment;Breastfeeding assistance Mom called for latch assessment.  As LC entered the room , mom had latched the baby on the left breast and when LC checked the depth the baby was sucking on the top part of the nipple.  LC  assisted to release the suction and the nipple was slanted.  LC recommended trying the other breast, football and latched on and off and then depth obtained, fed for 5 mins. Nursery called for nurse to bring the baby to nursery for a Circ.  LC set up the DEBP and checked the flange size #30 is still a good fit.  Per mom comfortable.  LC encouraged to call.   Maternal Data Has patient been taught Hand Expression?: Yes Does the patient have breastfeeding experience prior to this delivery?: Yes How long did the patient breastfeed?: per mom attempted and ended up pumping and  bottle feed for 6 months  Feeding Mother's Current Feeding Choice: Breast Milk and Formula Nipple Type: Slow - flow  LATCH Score Latch: Repeated attempts needed to sustain latch, nipple held in mouth throughout feeding, stimulation needed to elicit sucking reflex. (football position)  Audible Swallowing: None  Type of Nipple: Everted at rest and after stimulation  Comfort (Breast/Nipple): Soft / non-tender  Hold (Positioning): Assistance needed to correctly position infant at breast and maintain latch.  LATCH Score: 6   Lactation Tools Discussed/Used Tools: Pump;Flanges Flange Size: 30 Breast pump type: Double-Electric Breast Pump;Manual Pump Education: Setup, frequency, and cleaning;Milk Storage Reason for Pumping: need to enhance the flow and milk coming in  Interventions Interventions: Breast feeding basics reviewed;Assisted with latch;Skin to skin;Breast massage;Hand express;Pre-pump if needed;Adjust  position;Reverse pressure;Breast compression;Support pillows;Position options;DEBP;Hand pump;Education;CDC Guidelines for Breast Pump Cleaning  Discharge Pump: Manual;DEBP;Personal  Consult Status Consult Status: Follow-up Date: 12/10/23 Follow-up type: In-patient    Alice Nash 12/09/2023, 3:44 PM

## 2023-12-10 MED ORDER — GLYCERIN (LAXATIVE) 2 G RE SUPP
1.0000 | Freq: Once | RECTAL | Status: AC
  Administered 2023-12-10: 1 via RECTAL
  Filled 2023-12-10: qty 1

## 2023-12-10 MED ORDER — NIFEDIPINE ER OSMOTIC RELEASE 30 MG PO TB24
30.0000 mg | ORAL_TABLET | Freq: Every day | ORAL | Status: DC
  Administered 2023-12-10 – 2023-12-11 (×2): 30 mg via ORAL
  Filled 2023-12-10 (×2): qty 1

## 2023-12-10 MED ORDER — HYDROCORT-PRAMOXINE (PERIANAL) 1-1 % EX FOAM
1.0000 | Freq: Three times a day (TID) | CUTANEOUS | Status: DC
  Administered 2023-12-10: 1 via RECTAL
  Filled 2023-12-10: qty 10

## 2023-12-10 MED ORDER — MAGNESIUM OXIDE -MG SUPPLEMENT 400 (240 MG) MG PO TABS
400.0000 mg | ORAL_TABLET | Freq: Two times a day (BID) | ORAL | Status: DC
Start: 2023-12-10 — End: 2023-12-11
  Administered 2023-12-10 – 2023-12-11 (×2): 400 mg via ORAL
  Filled 2023-12-10 (×2): qty 1

## 2023-12-10 NOTE — Lactation Note (Signed)
This note was copied from a baby's chart. Lactation Consultation Note  Patient Name: Alice Nash [id number] Date: 12/10/2023 Age:40 hours Reason for consult: Follow-up assessment [name] had 3 mls of colostrum on counter she pumped 3 hours ago. LC unable to assist with latch infant was formula fed 25 mls of colostrum less than 2 hours ago, infant given 3 mls of EBM. LC reviewed how to use DEBP, and [name] was still expressing colostrum as LC left the room, had expressed 8 mls. [name], informed LC she exclusively pumped for one year with her first child due to difficulties with latching him at the breast. [name] will continue to attempt to latch infant first for every feeding and afterwards supplement infant with any EBM first before formula. [name] knows to call for latch assistance if needed. [name] knows that her EBM is safe for 4 hours at room temperature whereas formula must be used within 1 hour once open. [name] will continue to use DEBP every 3 hours for 15 minutes on initial setting and give infant back her EBM first before offering formula.[name] will continue to feed infant by cues, every 2 to 3 hours, skin to skin.   Maternal Data    Feeding Mother's Current Feeding Choice: Breast Milk and Formula  LATCH Score                    Lactation Tools Discussed/Used Tools: Flanges;Pump Flange Size: 30 Breast pump type: Double-Electric Breast Pump Pumping frequency: [name] will continue to use DEBP every 3 hours for 15 minutes on inital setting. Pumped volume: 8 mL ([name] was still expressing colostrum when LC left the room.)  Interventions Interventions: DEBP;Expressed milk;Education  Discharge    Consult Status Consult Status: Follow-up Date: 12/10/23 Follow-up type: In-patient    Frederico Hamman 12/10/2023, 1:04 AM

## 2023-12-10 NOTE — Progress Notes (Signed)
Subjective: POD# 2 Information for the patient's newborn:  Niyla, Marone [[id number]]  female  Baby's Name Marcello Moores Circumcision completed  Reports feeling sore and tired Feeding: breast Reports tolerating PO and denies N/V, foley removed, ambulating and urinating w/o difficulty  Pain controlled with acetaminophen and ibuprofen (OTC) Denies HA/SOB/dizziness  Flatus passing, concerned for BM due to painful hemorrhoids. Discussed glycerin suppository, mag oxide, comfort measures Vaginal bleeding is normal, no clots     Objective:  VS:  Vitals:   12/09/23 1518 12/09/23 2015 12/10/23 0605 12/10/23 1556  BP: 128/71 131/78 130/72 (!) 140/80  Pulse: 82 87 78 77  Resp: 18 18 18 16   Temp: 97.7 F (36.5 C) 98 F (36.7 C) 98.4 F (36.9 C) 99.5 F (37.5 C)  TempSrc: Oral Oral Oral Oral  SpO2: 100% 100% 100% 100%  Weight:      Height:       No intake or output data in the 24 hours ending 12/10/23 1651   Recent Labs    12/08/23 0112 12/08/23 1243 12/09/23 0455  WBC 9.0  --  11.6*  HGB 10.5* 9.5* 8.2*  HCT 30.8* 28.0* 23.8*  PLT 202  --  179    Blood type: --/--/O POS (01/29 0112) Rubella: Immune (07/07 0000)    Physical Exam:  General: alert, cooperative, and no distress CV: Regular rate and rhythm Resp: clear Abdomen: soft, nontender, normal bowel sounds Incision:  honeycomb was changed on 12/08/23 and is C/D/I Perineum: intact Uterine Fundus: firm, below umbilicus, nontender Lochia: minimal Ext: no edema, negative for tenderness, pain, and cords   Assessment/Plan: 38 y.o.   POD# 2. [id number]                  Principal Problem:   Chronic hypertension Active Problems:   Breech presentation, no version   Routine post-op PP care          Advance diet as tolerated Advised warm fluids and ambulation to improve GI motility Proctofoam for hemorrhoids Glycerin suppository x1 Lactation support PRN Anticipate D/C on POD#3  Roma Schanz, DNP, CNM 12/10/2023, 4:51  PM

## 2023-12-10 NOTE — Progress Notes (Addendum)
The Rn called [facility] MD regarding the patients's increased BPs.  The MD ordered 30 MG XL procardia once a day and to start medication now. The patient's next vital signs are  to follow routine schedule next vital signs are at 0500 am.  The RN is to call MD if BP is equal to or greater than 160/110.

## 2023-12-10 NOTE — Lactation Note (Signed)
This note was copied from a baby's chart. Lactation Consultation Note  Patient Name: Boy Kamali Sakata ZOXWR'[id number] Date: 12/10/2023 Age:40 hours Reason for consult: Follow-up assessment;Mother's request;Difficult latch;Term (change in weight loss -2.84 to -2.66 today, [name] is offering pumped EBM and formula, infant's intake today is 30 to 35 mls per feeding.)  [name] concern regarding her milk supply, informed LC she turned pumped dial on the highest setting when pumping 3 hours ago only expressed 3 mls unlike when pumping last night. LC discussed not to turn pump on highest setting but comfortable setting to prevent trauma and pain. LC worked with [name], she did combination of hand expression and pumping and expressed 13 mls.   Infant started cuing to feed while LC was in the room, LC suggested [name] try the football hold position with pillow support, [name] hand express few drops of colostrum,then gently  tap infant's top lip with breast bring infant chin first, infant latched without difficulty, taking [name] whole areola in mouth, after a few attempts infant sustained his latch breastfeeding for 18 minutes. Per [name], she felt tug but no pain with this  latch. Infant was given 13 mls of colostrum and family with supplement infant with 20 kcal formula afterwards offer 22 to 25 mls  of formula to keep infant at total 30 to 35 mls per feeding based on infant's age.  [name] informed LC she had hx engorgement with 1st child and aware of what that is, LC reviewed engorgement treatment and prevention.  [name] with think about being discharged or either staying to work on infant's latch.   Plan: 1- Continue to BF infant, latch infant 1st every feeding and then afterwards supplement infant with any EBM first and then offer, [name] knows infant intake on Day 3 is 30 mls + per feeding. 2- Continue to pump every 3 hours for 15 minutes on initial setting but do not turn pump dial on its highest setting. 3- Give infant her EBM first  and then offer formula. 4- LC discussed engorgement prevention, suggested if discharge to go to [facility] outpatient clinic for one on one consult to work on infant's latch, discussed to attend BF support group information given.    Maternal Data    Feeding Mother's Current Feeding Choice: Breast Milk and Formula  LATCH Score Latch: Grasps breast easily, tongue down, lips flanged, rhythmical sucking.  Audible Swallowing: A few with stimulation  Type of Nipple: Everted at rest and after stimulation  Comfort (Breast/Nipple): Soft / non-tender  Hold (Positioning): Assistance needed to correctly position infant at breast and maintain latch.  LATCH Score: 8   Lactation Tools Discussed/Used Tools: Pump Flange Size: 30 Breast pump type: Double-Electric Breast Pump Pump Education: Setup, frequency, and cleaning Reason for Pumping: [name] has been using the DEBP due infant not latching at the breast, but [name] would like to latch infant, Pumping frequency: [name] will continue to use DEBP every 3 hours for 15 minutes on inital setting. Pumped volume: 13 mL (With pumping and hand expression)  Interventions Interventions: Breast compression;Adjust position;Support pillows;Position options;DEBP;Education;Pace feeding;LC Services brochure  Discharge Discharge Education: Engorgement and breast care;Outpatient recommendation Pump: Personal;DEBP  Consult Status Consult Status: Follow-up Date: 12/11/23 ([name] will decided either to stay to work on latching infant at the breast or be discharged.) Follow-up type: In-patient    Frederico Hamman 12/10/2023, 5:02 PM

## 2023-12-10 NOTE — Lactation Note (Signed)
This note was copied from a baby's chart. Lactation Consultation Note  Patient Name: Alice Nash [id number] Date: 12/10/2023 Age:38 years Reason for consult: Follow-up assessment;Mother's request [name] asked for latch assistance, infant given 13 mls of formula prior to [facility] entering the room, still cuing to feed, [name] used pillow support and latched infant on her left breast, using the football hold position, infant was on and off the breast for 8 minutes, still BF after LC left the room. [name] will continue to work on latching infant at the breast, knows to ask for further latch assistance if need. [name] plans to continue to pump and supplement infant with EBM and then formula as she works towards latching infant at the breast. Infant will be given the 6 mls of EBM that [name] pulled earlier as he finish feeding at the breast.   Maternal Data    Feeding Mother's Current Feeding Choice: Breast Milk and Formula  LATCH Score Latch: Grasps breast easily, tongue down, lips flanged, rhythmical sucking.  Audible Swallowing: A few with stimulation  Type of Nipple: Everted at rest and after stimulation  Comfort (Breast/Nipple): Soft / non-tender  Hold (Positioning): Assistance needed to correctly position infant at breast and maintain latch.  LATCH Score: 8   Lactation Tools Discussed/Used    Interventions Interventions: Skin to skin;Assisted with latch;Breast compression;Adjust position;Support pillows;Position options  Discharge    Consult Status Consult Status: Follow-up Date: 12/11/23 Follow-up type: In-patient    Frederico Hamman 12/10/2023, 8:52 PM

## 2023-12-11 ENCOUNTER — Encounter (HOSPITAL_COMMUNITY): Payer: Self-pay | Admitting: Obstetrics & Gynecology

## 2023-12-11 MED ORDER — OXYCODONE HCL 5 MG PO TABS: 5.0000 mg | ORAL_TABLET | Freq: Four times a day (QID) | ORAL | 0 refills | Status: AC | PRN

## 2023-12-11 MED ORDER — DIBUCAINE (PERIANAL) 1 % EX OINT: 1.0000 | TOPICAL_OINTMENT | CUTANEOUS | 1 refills | Status: AC | PRN

## 2023-12-11 MED ORDER — MAGNESIUM OXIDE -MG SUPPLEMENT 400 (240 MG) MG PO TABS: 400.0000 mg | ORAL_TABLET | Freq: Two times a day (BID) | ORAL | 0 refills | Status: AC

## 2023-12-11 MED ORDER — NIFEDIPINE ER 30 MG PO TB24: 30.0000 mg | ORAL_TABLET | Freq: Every day | ORAL | 0 refills | Status: AC

## 2023-12-11 MED ORDER — IBUPROFEN 600 MG PO TABS: 600.0000 mg | ORAL_TABLET | Freq: Four times a day (QID) | ORAL | 0 refills | Status: AC

## 2023-12-11 MED ORDER — PROCTOZONE-HC 2.5 % EX CREA: TOPICAL_CREAM | Freq: Two times a day (BID) | CUTANEOUS | 0 refills | Status: AC

## 2023-12-11 NOTE — Lactation Note (Signed)
This note was copied from a baby's chart. Lactation Consultation Note  Patient Name: Alice Nash [id number] Date: 12/11/2023 Age:40 hours, P2  Reason for consult: Follow-up assessment;Infant weight loss;Term (4 % weight loss,) D/C has been written.  Per mom the baby is latching on the left breast for 8-18 mins and pumping. The most pumping off is 15 ml and per mom is getting more consistent.  LC reviewed BF D/C teaching and importance of engorgement prevention and tx., storage of breast milk.  See below for LC O/P F/U.    Maternal Data Has patient been taught Hand Expression?: Yes  Feeding Mother's Current Feeding Choice: Breast Milk and Formula Nipple Type: Slow - flow  LATCH Score last 2 - 8's    Lactation Tools Discussed/Used Tools: Pump Flange Size: 30;27 Breast pump type: Manual;Double-Electric Breast Pump Pump Education: Milk Storage;Setup, frequency, and cleaning Pumped volume: 15 mL  Interventions Interventions: Breast feeding basics reviewed;Reverse pressure;Hand pump;DEBP;Education;LC Services brochure;CDC Guidelines for Breast Pump Cleaning  Discharge Discharge Education: Engorgement and breast care;Warning signs for feeding baby;Outpatient recommendation;Outpatient Epic message sent;Other (comment) (mom receptive to do a LC O/P F/U and aware she will receive a call next week) Pump: Personal;DEBP;Manual  Consult Status Consult Status: Complete Date: 12/11/23    Matilde Sprang Sharie Amorin 12/11/2023, 10:10 AM

## 2023-12-11 NOTE — Discharge Summary (Signed)
Postpartum Discharge Summary  Date of Service updated 12/11/23    Patient Name: Alice Nash DOB: 29-Nov-1985 MRN: [id number]  Date of admission: 12/08/2023 Delivery date:12/08/2023 Delivering provider: Hoover Browns Date of discharge: 12/11/2023  Admitting diagnosis: Chronic hypertension [I10] Intrauterine pregnancy: [redacted]w[redacted]d     Secondary diagnosis:  Principal Problem:   Chronic hypertension Active Problems:   Breech presentation, no version   Postpartum care following cesarean delivery  Additional problems: none    Discharge diagnosis: Term Pregnancy Delivered and CHTN                                              Post partum procedures: none Augmentation: N/A Complications: None  Hospital course: Scheduled C/S   38 y.o. yo G2P2001 at [redacted]w[redacted]d was admitted to the hospital 12/08/2023 for scheduled cesarean section with the following indication:Elective Repeat after unsuccessful attempt at an external cephalic version. Delivery details are as follows:  Membrane Rupture Time/Date: 12:09 PM,12/08/2023  Delivery Method:C-Section, Low Transverse Operative Delivery:N/A Details of operation can be found in separate operative note.  Patient had a postpartum course complicated byNONE.  She is ambulating, tolerating a regular diet, passing flatus, and urinating well. Patient is discharged home in stable condition on  12/11/23        Newborn Data: Birth date:12/08/2023 Birth time:12:10 PM Gender:Female Living status:Living Apgars:9 ,9  Weight:3000 g    Magnesium Sulfate received: No BMZ received: No Rhophylac:N/A MMR:N/A Transfusion:No Immunizations administered: There is no immunization history for the selected administration types on file for this patient.  Physical exam  Vitals:   12/10/23 1556 12/10/23 1917 12/10/23 2001 12/11/23 0520  BP: (!) 140/80 (!) 152/83 (!) 142/79 (!) 147/75  Pulse: 77 79 83 92  Resp: 16  16 18   Temp: 99.5 F (37.5 C)  98 F (36.7 C) 98.1 F (36.7 C)   TempSrc: Oral  Oral Oral  SpO2: 100%     Weight:      Height:       General: alert, cooperative, and no distress Lochia: appropriate Uterine Fundus: firm Incision: Healing well with no significant drainage, Dressing is clean, dry, and intact DVT Evaluation: No evidence of DVT seen on physical exam. No cords or calf tenderness. No significant calf/ankle edema. Labs: Lab Results  Component Value Date   WBC 11.6 (H) 12/09/2023   HGB 8.2 (L) 12/09/2023   HCT 23.8 (L) 12/09/2023   MCV 87.8 12/09/2023   PLT 179 12/09/2023      Latest Ref Rng & Units 12/08/2023   12:43 PM  CMP  Sodium 135 - 145 mmol/L 136   Potassium 3.5 - 5.1 mmol/L 4.9    Edinburgh Score:    12/09/2023   12:29 PM  Edinburgh Postnatal Depression Scale Screening Tool  I have been able to laugh and see the funny side of things. 0  I have looked forward with enjoyment to things. 0  I have blamed myself unnecessarily when things went wrong. 0  I have been anxious or worried for no good reason. 0  I have felt scared or panicky for no good reason. 0  Things have been getting on top of me. 0  I have been so unhappy that I have had difficulty sleeping. 0  I have felt sad or miserable. 0  I have been so unhappy that I have been crying.  0  The thought of harming myself has occurred to me. 0  Edinburgh Postnatal Depression Scale Total 0      After visit meds:  Allergies as of 12/11/2023   No Known Allergies      Medication List     STOP taking these medications    Jencycla 0.35 MG tablet Generic drug: norethindrone       TAKE these medications    dibucaine 1 % Oint Commonly known as: NUPERCAINAL Place 1 Application rectally as needed for up to 10 days for hemorrhoids.   ibuprofen 600 MG tablet Commonly known as: ADVIL Take 1 tablet (600 mg total) by mouth every 6 (six) hours.   labetalol 100 MG tablet Commonly known as: NORMODYNE Take 100 mg by mouth 2 (two) times daily.   magnesium oxide  400 (240 Mg) MG tablet Commonly known as: MAG-OX Take 1 tablet (400 mg total) by mouth 2 (two) times daily.   NIFEdipine 30 MG 24 hr tablet Commonly known as: ADALAT CC Take 1 tablet (30 mg total) by mouth daily.   oxyCODONE 5 MG immediate release tablet Commonly known as: Oxy IR/ROXICODONE Take 1 tablet (5 mg total) by mouth every 6 (six) hours as needed for up to 5 days for moderate pain (pain score 4-6). What changed: reasons to take this   Proctozone-HC 2.5 % rectal cream Generic drug: hydrocortisone Place rectally 2 (two) times daily.         Discharge home in stable condition Infant Feeding: Breast Infant Disposition:home with mother Discharge instruction: per After Visit Summary and Postpartum booklet. Activity: Advance as tolerated. Pelvic rest for 6 weeks.  Diet: low salt diet Anticipated Birth Control: Unsure Postpartum Appointment:6 weeks Additional Postpartum F/U: BP check 1 week Future Appointments:No future appointments. Follow up Visit:  Follow-up Information     Hoover Browns, MD. Schedule an appointment as soon as possible for a visit in 2 week(s).   Specialty: Obstetrics and Gynecology Contact information: [address] [place] [address] [phone]                     12/11/2023 Roma Schanz, CNM

## 2023-12-16 ENCOUNTER — Telehealth (HOSPITAL_COMMUNITY): Payer: Self-pay | Admitting: *Deleted

## 2023-12-16 NOTE — Telephone Encounter (Signed)
 12/16/2023  Name: Alice Nash MRN: [id number] DOB: Jun 04, 1986  Reason for Call:  Transition of Care Hospital Discharge Call  Contact Status: Patient Contact Status: Message  Language assistant needed:          Follow-Up Questions:    Van Postnatal Depression Scale:  In the Past 7 Days:    PHQ2-9 Depression Scale:     Discharge Follow-up:    Post-discharge interventions: NA  Mliss Sieve, RN 12/16/2023 11:19

## 2024-01-25 DIAGNOSIS — Z01419 Encounter for gynecological examination (general) (routine) without abnormal findings: Secondary | ICD-10-CM | POA: Diagnosis not present

## 2024-01-25 DIAGNOSIS — Z308 Encounter for other contraceptive management: Secondary | ICD-10-CM | POA: Diagnosis not present

## 2024-01-25 DIAGNOSIS — Z1331 Encounter for screening for depression: Secondary | ICD-10-CM | POA: Diagnosis not present

## 2024-01-25 DIAGNOSIS — Z124 Encounter for screening for malignant neoplasm of cervix: Secondary | ICD-10-CM | POA: Diagnosis not present

## 2024-01-25 DIAGNOSIS — Z01411 Encounter for gynecological examination (general) (routine) with abnormal findings: Secondary | ICD-10-CM | POA: Diagnosis not present
